# Patient Record
Sex: Female | Born: 1985 | Race: Black or African American | Hispanic: No | Marital: Single | Smoking: Never smoker
Health system: Southern US, Community
[De-identification: ages and names within clinical notes are randomized; demographics above are authoritative.]

## PROBLEM LIST (undated history)

## (undated) DIAGNOSIS — O139 Gestational [pregnancy-induced] hypertension without significant proteinuria, unspecified trimester: Secondary | ICD-10-CM

## (undated) DIAGNOSIS — I10 Essential (primary) hypertension: Secondary | ICD-10-CM

## (undated) HISTORY — PX: WISDOM TOOTH EXTRACTION: SHX21

---

## 2010-11-14 ENCOUNTER — Inpatient Hospital Stay (INDEPENDENT_AMBULATORY_CARE_PROVIDER_SITE_OTHER)
Admission: RE | Admit: 2010-11-14 | Discharge: 2010-11-14 | Disposition: A | Discharge status: Home / Self Care | Payer: Self-pay | Source: Ambulatory Visit | Attending: Family Medicine | Admitting: Family Medicine

## 2010-11-14 DIAGNOSIS — N760 Acute vaginitis: Secondary | ICD-10-CM

## 2010-11-14 LAB — POCT PREGNANCY, URINE: Preg Test, Ur: NEGATIVE

## 2010-11-14 LAB — POCT URINALYSIS DIP (DEVICE)
Ketones, ur: NEGATIVE mg/dL
Nitrite: NEGATIVE
Protein, ur: NEGATIVE mg/dL
Urobilinogen, UA: 0.2 mg/dL (ref 0.0–1.0)

## 2010-11-17 LAB — GC/CHLAMYDIA PROBE AMP, GENITAL: Chlamydia, DNA Probe: POSITIVE — AB

## 2011-08-24 ENCOUNTER — Encounter (HOSPITAL_COMMUNITY): Payer: Self-pay | Admitting: Emergency Medicine

## 2011-08-24 ENCOUNTER — Emergency Department (HOSPITAL_COMMUNITY)
Admission: EM | Admit: 2011-08-24 | Discharge: 2011-08-25 | Disposition: A | Discharge status: Home / Self Care | Payer: Self-pay | Attending: Emergency Medicine | Admitting: Emergency Medicine

## 2011-08-24 DIAGNOSIS — R070 Pain in throat: Secondary | ICD-10-CM | POA: Insufficient documentation

## 2011-08-24 DIAGNOSIS — H9209 Otalgia, unspecified ear: Secondary | ICD-10-CM | POA: Insufficient documentation

## 2011-08-24 DIAGNOSIS — J020 Streptococcal pharyngitis: Secondary | ICD-10-CM | POA: Insufficient documentation

## 2011-08-24 DIAGNOSIS — R599 Enlarged lymph nodes, unspecified: Secondary | ICD-10-CM | POA: Insufficient documentation

## 2011-08-24 DIAGNOSIS — R509 Fever, unspecified: Secondary | ICD-10-CM | POA: Insufficient documentation

## 2011-08-24 DIAGNOSIS — J351 Hypertrophy of tonsils: Secondary | ICD-10-CM | POA: Insufficient documentation

## 2011-08-24 NOTE — ED Notes (Signed)
Patient presents with c/o bilateral ear pain and sore throat.  Ears red and throat red with white patches.

## 2011-08-24 NOTE — ED Notes (Signed)
PT. REPORTS PROGRESSING SORE THROAT WITH SWELLING AND BILATERAL EAR ACHE FOR SEVERAL DAYS , DENIES FEVER OR CHILLS, NO COUGH OR CONGESTION .

## 2011-08-25 LAB — RAPID STREP SCREEN (MED CTR MEBANE ONLY): Streptococcus, Group A Screen (Direct): POSITIVE — AB

## 2011-08-25 MED ORDER — HYDROCODONE-ACETAMINOPHEN 5-325 MG PO TABS
2.0000 | ORAL_TABLET | Freq: Once | ORAL | Status: AC
  Administered 2011-08-25: 2 via ORAL
  Filled 2011-08-25: qty 2

## 2011-08-25 MED ORDER — HYDROCODONE-ACETAMINOPHEN 5-325 MG PO TABS: 2.0000 | ORAL_TABLET | ORAL | Status: AC | PRN

## 2011-08-25 MED ORDER — PENICILLIN G BENZATHINE 1200000 UNIT/2ML IM SUSP
1.2000 10*6.[IU] | INTRAMUSCULAR | Status: AC
  Administered 2011-08-25: 1.2 10*6.[IU] via INTRAMUSCULAR
  Filled 2011-08-25: qty 2

## 2011-08-25 NOTE — ED Provider Notes (Signed)
History     CSN: [id number]  Arrival date & time 08/24/11  2138   First MD Initiated Contact with Patient 08/25/11 0113      Chief Complaint  Patient presents with  . Sore Throat    (Consider location/radiation/quality/duration/timing/severity/associated sxs/prior treatment) HPI Complains of sore throat and swollen tonsils for 3 weeks with subjective fever no treatment prior to coming here also complains of bilateral your aches, mild pain is worse with swallowing not improved by anything no cough no other complaint no other associated symptoms History reviewed. No pertinent past medical history. Past medical history negative History reviewed. No pertinent past surgical history.  No family history on file.  History  Substance Use Topics  . Smoking status: Never Smoker   . Smokeless tobacco: Not on file  . Alcohol Use: No    OB History    Grav Para Term Preterm Abortions TAB SAB Ect Mult Living                  Review of Systems  Constitutional: Positive for fever.  HENT: Positive for ear pain.        Sore throat  Respiratory: Negative.   Cardiovascular: Negative.   Gastrointestinal: Negative.   Musculoskeletal: Negative.   Skin: Negative.   Neurological: Negative.   Hematological: Negative.   Psychiatric/Behavioral: Negative.   All other systems reviewed and are negative.    Allergies  Review of patient's allergies indicates no known allergies.  Home Medications  No current outpatient prescriptions on file.  BP 153/94  Pulse 103  Temp(Src) 98.9 F (37.2 C) (Oral)  Resp 18  SpO2 100%  LMP 07/26/2011  Physical Exam  Nursing note and vitals reviewed. Constitutional: She appears well-developed and well-nourished.  HENT:  Head: Normocephalic and atraumatic.       Bilateral tympanic membranes normal. Tonsils large red , with white exudate uvula midline  Eyes: Conjunctivae are normal. Pupils are equal, round, and reactive to light.  Neck: Neck supple.  No tracheal deviation present. No thyromegaly present.  Cardiovascular: Normal rate and regular rhythm.   No murmur heard. Pulmonary/Chest: Effort normal and breath sounds normal.  Abdominal: Soft. Bowel sounds are normal. She exhibits no distension. There is no tenderness.       No splenomegaly  Musculoskeletal: Normal range of motion. She exhibits no edema and no tenderness.  Lymphadenopathy:    She has cervical adenopathy.  Neurological: She is alert. Coordination normal.  Skin: Skin is warm and dry. No rash noted.  Psychiatric: She has a normal mood and affect.    ED Course  Procedures (including critical care time) 3:15 AM patient resting comfortably no distress Labs Reviewed - No data to display No results found.   No diagnosis found.    MDM  Plan prescription Norco followup at [facility] if not better in 2 or 3 days. Diagnosis strep pharyngitis        Doug Sou, MD 08/25/11 [id number]

## 2017-11-25 ENCOUNTER — Encounter (HOSPITAL_COMMUNITY): Payer: Self-pay | Admitting: Emergency Medicine

## 2017-11-25 ENCOUNTER — Other Ambulatory Visit: Payer: Self-pay

## 2017-11-25 ENCOUNTER — Emergency Department (HOSPITAL_COMMUNITY)
Admission: EM | Admit: 2017-11-25 | Discharge: 2017-11-26 | Disposition: A | Discharge status: Home / Self Care | Payer: Self-pay | Attending: Emergency Medicine | Admitting: Emergency Medicine

## 2017-11-25 DIAGNOSIS — T50901A Poisoning by unspecified drugs, medicaments and biological substances, accidental (unintentional), initial encounter: Secondary | ICD-10-CM | POA: Insufficient documentation

## 2017-11-25 LAB — CBC
HCT: 42.8 % (ref 36.0–46.0)
Hemoglobin: 14.5 g/dL (ref 12.0–15.0)
MCH: 29.9 pg (ref 26.0–34.0)
MCHC: 33.9 g/dL (ref 30.0–36.0)
MCV: 88.2 fL (ref 78.0–100.0)
PLATELETS: 372 10*3/uL (ref 150–400)
RBC: 4.85 MIL/uL (ref 3.87–5.11)
RDW: 13.8 % (ref 11.5–15.5)
WBC: 11.9 10*3/uL — ABNORMAL HIGH (ref 4.0–10.5)

## 2017-11-25 NOTE — ED Triage Notes (Signed)
Pt has had migraine for a couple days, stated she was taking Excedrin migraine throughout the day. Pt took at least 10 but is not sure the dose or how many she took. Last dose was around 2125, pt has vomited PTA

## 2017-11-26 ENCOUNTER — Other Ambulatory Visit: Payer: Self-pay

## 2017-11-26 LAB — ETHANOL

## 2017-11-26 LAB — COMPREHENSIVE METABOLIC PANEL
ALBUMIN: 5.2 g/dL — AB (ref 3.5–5.0)
ALK PHOS: 74 U/L (ref 38–126)
ALK PHOS: 75 U/L (ref 38–126)
ALT: 13 U/L — AB (ref 14–54)
ALT: 14 U/L (ref 14–54)
ANION GAP: 20 — AB (ref 5–15)
AST: 29 U/L (ref 15–41)
AST: 30 U/L (ref 15–41)
Albumin: 4.9 g/dL (ref 3.5–5.0)
Anion gap: 16 — ABNORMAL HIGH (ref 5–15)
BUN: 9 mg/dL (ref 6–20)
BUN: 8 mg/dL (ref 6–20)
CALCIUM: 10.1 mg/dL (ref 8.9–10.3)
CHLORIDE: 105 mmol/L (ref 101–111)
CO2: 19 mmol/L — ABNORMAL LOW (ref 22–32)
CO2: 19 mmol/L — ABNORMAL LOW (ref 22–32)
Calcium: 10.1 mg/dL (ref 8.9–10.3)
Chloride: 103 mmol/L (ref 101–111)
Creatinine, Ser: 1.09 mg/dL — ABNORMAL HIGH (ref 0.44–1.00)
Creatinine, Ser: 1.1 mg/dL — ABNORMAL HIGH (ref 0.44–1.00)
GFR calc Af Amer: >60 mL/min (ref >60)
GFR calc non Af Amer: >60 mL/min (ref >60)
GLUCOSE: 83 mg/dL (ref 65–99)
Glucose, Bld: 98 mg/dL (ref 65–99)
POTASSIUM: 3.4 mmol/L — AB (ref 3.5–5.1)
Potassium: 3.6 mmol/L (ref 3.5–5.1)
SODIUM: 144 mmol/L (ref 135–145)
Sodium: 138 mmol/L (ref 135–145)
TOTAL PROTEIN: 8.6 g/dL — AB (ref 6.5–8.1)
TOTAL PROTEIN: 8.7 g/dL — AB (ref 6.5–8.1)
Total Bilirubin: 1.4 mg/dL — ABNORMAL HIGH (ref 0.3–1.2)
Total Bilirubin: 1.6 mg/dL — ABNORMAL HIGH (ref 0.3–1.2)

## 2017-11-26 LAB — RAPID URINE DRUG SCREEN, HOSP PERFORMED
Amphetamines: NOT DETECTED
BARBITURATES: NOT DETECTED
Benzodiazepines: NOT DETECTED
COCAINE: POSITIVE — AB
Opiates: NOT DETECTED
Tetrahydrocannabinol: POSITIVE — AB

## 2017-11-26 LAB — ACETAMINOPHEN LEVEL: ACETAMINOPHEN (TYLENOL), SERUM: 14 ug/mL (ref 10–30)

## 2017-11-26 LAB — SALICYLATE LEVEL
SALICYLATE LVL: 7.5 mg/dL (ref 2.8–30.0)
Salicylate Lvl: 8.7 mg/dL (ref 2.8–30.0)

## 2017-11-26 LAB — I-STAT BETA HCG BLOOD, ED (MC, WL, AP ONLY): I-stat hCG, quantitative: <5 m[IU]/mL (ref <5)

## 2017-11-26 LAB — CBG MONITORING, ED: GLUCOSE-CAPILLARY: 102 mg/dL — AB (ref 65–99)

## 2017-11-26 MED ORDER — ONDANSETRON 4 MG PO TBDP
8.0000 mg | ORAL_TABLET | Freq: Once | ORAL | Status: AC
  Administered 2017-11-26: 8 mg via ORAL
  Filled 2017-11-26: qty 2

## 2017-11-26 NOTE — Discharge Instructions (Addendum)
Do not take more medication than the instructions on the bottle tell you.  For extra pain relief, you can combine acetaminophen and ibuprofen - they work in different ways, and the combination gives good pain relief.

## 2017-11-26 NOTE — ED Notes (Signed)
Spoke with Danford Bad from [employer] to report possible poisoning. She recommends repeating acetaminophen, salicylate, and CMP at 0330. Call back number left for follow up at 0530.

## 2017-11-26 NOTE — ED Notes (Signed)
Reported newest lab results to [place] from poison control and she sts pt is safe for d/c when EDP decides.

## 2017-11-26 NOTE — ED Provider Notes (Signed)
[facility] EMERGENCY DEPARTMENT Provider Note   CSN: [id number] Arrival date & time: 11/25/17  2314     History   Chief Complaint Chief Complaint  Patient presents with  . Ingestion    HPI Tracy Black is a 32 y.o. female.  The history is provided by the patient.  She states that she has had a bad headache for 2 days and she took 3 Excedrin tablets at a time on 3 separate occasions through the day yesterday.  She then became worried that she might have accidentally overdosed.  She did have some nausea and vomiting.  There is still some mild residual nausea.  Her headache has completely resolved.  She denies any abdominal pain.  She has not had any rash.  History reviewed. No pertinent past medical history.  There are no active problems to display for this patient.   History reviewed. No pertinent surgical history.   OB History   None      Home Medications    Prior to Admission medications   Not on File    Family History No family history on file.  Social History Social History   Tobacco Use  . Smoking status: Never Smoker  . Smokeless tobacco: Never Used  Substance Use Topics  . Alcohol use: No  . Drug use: No     Allergies   Patient has no known allergies.   Review of Systems Review of Systems  All other systems reviewed and are negative.    Physical Exam Updated Vital Signs BP (!) 146/108 (BP Location: Right Arm)   Pulse 80   Temp 98.3 F (36.8 C) (Oral)   Resp 18   Ht  (1.702 m)   Wt 70.3 kg (155 lb)   SpO2 100%   BMI 24.28 kg/m   Physical Exam  Nursing note and vitals reviewed.  32 year old female, resting comfortably and in no acute distress. Vital signs are significant for elevated blood pressure. Oxygen saturation is 100%, which is normal. Head is normocephalic and atraumatic. PERRLA, EOMI. Oropharynx is clear. Neck is nontender and supple without adenopathy or JVD. Back is nontender and there is no CVA  tenderness. Lungs are clear without rales, wheezes, or rhonchi. Chest is nontender. Heart has regular rate and rhythm without murmur. Abdomen is soft, flat, nontender without masses or hepatosplenomegaly and peristalsis is normoactive. Extremities have no cyanosis or edema, full range of motion is present. Skin is warm and dry without rash. Neurologic: Mental status is normal, cranial nerves are intact, there are no motor or sensory deficits.  ED Treatments / Results  Labs (all labs ordered are listed, but only abnormal results are displayed) Labs Reviewed  COMPREHENSIVE METABOLIC PANEL - Abnormal; Notable for the following components:      Result Value   Potassium 3.4 (*)    CO2 19 (*)    Creatinine, Ser 1.09 (*)    Total Protein 8.7 (*)    Albumin 5.2 (*)    Total Bilirubin 1.4 (*)    Anion gap 20 (*)    All other components within normal limits  CBC - Abnormal; Notable for the following components:   WBC 11.9 (*)    All other components within normal limits  RAPID URINE DRUG SCREEN, HOSP PERFORMED - Abnormal; Notable for the following components:   Cocaine POSITIVE (*)    Tetrahydrocannabinol POSITIVE (*)    All other components within normal limits  ACETAMINOPHEN LEVEL - Abnormal; Notable  for the following components:   Acetaminophen (Tylenol), Serum <10 (*)    All other components within normal limits  COMPREHENSIVE METABOLIC PANEL - Abnormal; Notable for the following components:   CO2 19 (*)    Creatinine, Ser 1.10 (*)    Total Protein 8.6 (*)    ALT 13 (*)    Total Bilirubin 1.6 (*)    Anion gap 16 (*)    All other components within normal limits  CBG MONITORING, ED - Abnormal; Notable for the following components:   Glucose-Capillary 102 (*)    All other components within normal limits  ETHANOL  SALICYLATE LEVEL  ACETAMINOPHEN LEVEL  SALICYLATE LEVEL  I-STAT BETA HCG BLOOD, ED ([facility], [facility], [facility] ONLY)    EKG EKG Interpretation  Date/Time:  Thursday Nov 25 2017  23:27:36 EDT Ventricular Rate:  94 PR Interval:  128 QRS Duration: 66 QT Interval:  354 QTC Calculation: 442 R Axis:   5 Text Interpretation:  Normal sinus rhythm with sinus arrhythmia Possible Left atrial enlargement Borderline ECG No old tracing to compare Confirmed by Dione Booze ([id number]) on 11/26/2017 1:44:11 AM  Procedures Procedures  Medications Ordered in ED Medications  ondansetron (ZOFRAN-ODT) disintegrating tablet 8 mg (has no administration in time range)     Initial Impression / Assessment and Plan / ED Course  I have reviewed the triage vital signs and the nursing notes.  Pertinent lab results that were available during my care of the patient were reviewed by me and considered in my medical decision making (see chart for details).  Accidental overdose.  Poison control had been consulted and recommended acetaminophen, salicylate levels and CMP's.  Initial acetaminophen level was 14 and initial salicylate level 8.7-both in the therapeutic range and nontoxic.  Repeat acetaminophen level is less than 10, repeat salicylate level 7.5.  These are not levels that would be associated with any toxicity.  Initial metabolic panel did show an elevated anion gap with a CO2 of 19.  That could have been related to salicylate ingestion.  Repeat metabolic panel shows anion gap has decreased significantly although CO2 is unchanged.  She is felt to be safe for discharge at this point.  Renal function and hepatic function tests are normal.  Patient is advised never to exceed manufactures recommendation on either dose or frequency of any medication.  Final Clinical Impressions(s) / ED Diagnoses   Final diagnoses:  Accidental drug ingestion, initial encounter    ED Discharge Orders    None       Dione Booze, MD 11/26/17 [phone]

## 2018-03-16 ENCOUNTER — Other Ambulatory Visit: Payer: Self-pay

## 2018-03-16 ENCOUNTER — Emergency Department (HOSPITAL_COMMUNITY): Payer: Self-pay

## 2018-03-16 ENCOUNTER — Encounter (HOSPITAL_COMMUNITY): Payer: Self-pay

## 2018-03-16 ENCOUNTER — Emergency Department (HOSPITAL_COMMUNITY)
Admission: EM | Admit: 2018-03-16 | Discharge: 2018-03-16 | Disposition: A | Discharge status: Home / Self Care | Payer: Self-pay | Attending: Emergency Medicine | Admitting: Emergency Medicine

## 2018-03-16 DIAGNOSIS — I10 Essential (primary) hypertension: Secondary | ICD-10-CM | POA: Insufficient documentation

## 2018-03-16 DIAGNOSIS — S29011A Strain of muscle and tendon of front wall of thorax, initial encounter: Secondary | ICD-10-CM

## 2018-03-16 DIAGNOSIS — Z79899 Other long term (current) drug therapy: Secondary | ICD-10-CM | POA: Insufficient documentation

## 2018-03-16 DIAGNOSIS — R079 Chest pain, unspecified: Secondary | ICD-10-CM | POA: Insufficient documentation

## 2018-03-16 LAB — BASIC METABOLIC PANEL
ANION GAP: 11 (ref 5–15)
BUN: 12 mg/dL (ref 6–20)
CO2: 21 mmol/L — AB (ref 22–32)
CREATININE: 0.96 mg/dL (ref 0.44–1.00)
Calcium: 9.2 mg/dL (ref 8.9–10.3)
Chloride: 106 mmol/L (ref 98–111)
GFR calc Af Amer: >60 mL/min (ref >60)
GLUCOSE: 101 mg/dL — AB (ref 70–99)
Potassium: 3.4 mmol/L — ABNORMAL LOW (ref 3.5–5.1)
Sodium: 138 mmol/L (ref 135–145)

## 2018-03-16 LAB — CBC
HCT: 39.6 % (ref 36.0–46.0)
Hemoglobin: 12.8 g/dL (ref 12.0–15.0)
MCH: 29.8 pg (ref 26.0–34.0)
MCHC: 32.3 g/dL (ref 30.0–36.0)
MCV: 92.3 fL (ref 78.0–100.0)
PLATELETS: 316 10*3/uL (ref 150–400)
RBC: 4.29 MIL/uL (ref 3.87–5.11)
RDW: 13.5 % (ref 11.5–15.5)
WBC: 9.8 10*3/uL (ref 4.0–10.5)

## 2018-03-16 LAB — I-STAT BETA HCG BLOOD, ED (MC, WL, AP ONLY): I-stat hCG, quantitative: <5 m[IU]/mL (ref <5)

## 2018-03-16 LAB — I-STAT TROPONIN, ED: Troponin i, poc: 0 ng/mL (ref 0.00–0.08)

## 2018-03-16 MED ORDER — METHOCARBAMOL 500 MG PO TABS: 500.0000 mg | ORAL_TABLET | Freq: Two times a day (BID) | ORAL | 0 refills | Status: DC | PRN

## 2018-03-16 MED ORDER — NAPROXEN 500 MG PO TABS: 500.0000 mg | ORAL_TABLET | Freq: Two times a day (BID) | ORAL | 0 refills | Status: DC | PRN

## 2018-03-16 MED ORDER — NAPROXEN 250 MG PO TABS
500.0000 mg | ORAL_TABLET | Freq: Once | ORAL | Status: AC
  Administered 2018-03-16: 500 mg via ORAL
  Filled 2018-03-16: qty 2

## 2018-03-16 MED ORDER — HYDROCODONE-ACETAMINOPHEN 5-325 MG PO TABS
1.0000 | ORAL_TABLET | Freq: Once | ORAL | Status: AC
  Administered 2018-03-16: 1 via ORAL
  Filled 2018-03-16: qty 1

## 2018-03-16 MED ORDER — AMLODIPINE BESYLATE 5 MG PO TABS: 5.0000 mg | ORAL_TABLET | Freq: Every day | ORAL | 1 refills | Status: DC

## 2018-03-16 NOTE — ED Notes (Signed)
Pt called x3 for room, no response. 

## 2018-03-16 NOTE — ED Provider Notes (Signed)
Patient placed in Quick Look pathway, seen and evaluated   Chief Complaint: chest pain  HPI:   Tracy Black is a 32 y.o. female who presents to the ED with right side chest pain that started 3 days ago. Patient reports that the pain increases with deep breath and movement. Patient took tylenol without relief. Patient reports she has been lifting her child and doing a lot of house work the past couple day. Patient rates her pain as 10/10.   ROS: M/S: chest wall pain  Physical Exam:  BP (!) 147/108 (BP Location: Right Arm)   Pulse 73   Temp 98.6 F (37 C) (Oral)   Resp 18   Ht 5\' 7"  (1.702 m)   Wt 74.8 kg   SpO2 100%   BMI 25.84 kg/m    Gen: No distress  Neuro: Awake and Alert  Skin: Warm and dry  Chest: tender with palpation to right chest wall and pain with range of motion of the right arm.      Initiation of care has begun. The patient has been counseled on the process, plan, and necessity for staying for the completion/evaluation, and the remainder of the medical screening examination    Janne Napoleoneese, Saraih Lorton M, NP 03/16/18 2052    Tilden Fossaees, Elizabeth, MD 03/17/18 [phone]

## 2018-03-16 NOTE — ED Triage Notes (Signed)
Pt reports that for the past 3 days she has been having R sided CP, worse with deep breath, movement and palpation, some nausea, denies dizziness

## 2018-03-16 NOTE — ED Provider Notes (Signed)
[facility]   CSN: [id number] Arrival date & time: 03/16/18  2035     History   Chief Complaint Chief Complaint  Patient presents with  . Chest Pain    HPI Tracy Black is a 32 y.o. female.   32 year old female with a history of hypertension (previously on Diovan) presents to the emergency department for evaluation of right-sided chest pain.  She has noticed worsening pain over the past 3 days, especially when breathing, moving, and abducting her right arm.  She has tried Tylenol for symptoms without relief.  Pain acutely worsened during the night, waking her from sleep.  She describes the pain as sharp.  She has not had any shortness of breath, syncope or near syncope, hemoptysis, leg swelling, vomiting.  No history of tobacco use, diabetes, dyslipidemia.  Is not presently on birth control.  No recent surgeries or hospitalizations.  Denies prolonged travel.     History reviewed. No pertinent past medical history.  There are no active problems to display for this patient.   History reviewed. No pertinent surgical history.   OB History   None      Home Medications    Prior to Admission medications   Medication Sig Start Date End Date Taking? Authorizing Provider  amLODipine (NORVASC) 5 MG tablet Take 1 tablet (5 mg total) by mouth daily. 03/16/18   Antony MaduraHumes, Munirah Doerner, PA-C  methocarbamol (ROBAXIN) 500 MG tablet Take 1 tablet (500 mg total) by mouth every 12 (twelve) hours as needed for muscle spasms. 03/16/18   Antony MaduraHumes, Fausto Sampedro, PA-C  naproxen (NAPROSYN) 500 MG tablet Take 1 tablet (500 mg total) by mouth every 12 (twelve) hours as needed for mild pain or moderate pain. 03/16/18   Antony MaduraHumes, Aryan Sparks, PA-C    Family History No family history on file.  Social History Social History   Tobacco Use  . Smoking status: Never Smoker  . Smokeless tobacco: Never Used  Substance Use Topics  . Alcohol use: No  . Drug use: No      Allergies   Patient has no known allergies.   Review of Systems Review of Systems Ten systems reviewed and are negative for acute change, except as noted in the HPI.    Physical Exam Updated Vital Signs BP (!) 146/108 (BP Location: Right Arm)   Pulse 70   Temp 98.6 F (37 C) (Oral)   Resp 18   Ht 5\' 7"  (1.702 m)   Wt 74.8 kg   LMP 03/15/2018   SpO2 99%   BMI 25.84 kg/m   Physical Exam  Constitutional: She is oriented to person, place, and time. She appears well-developed and well-nourished. No distress.  Nontoxic appearing and in NAD  HENT:  Head: Normocephalic and atraumatic.  Eyes: Conjunctivae and EOM are normal. No scleral icterus.  Neck: Normal range of motion.  Cardiovascular: Normal rate, regular rhythm and intact distal pulses.  Pulmonary/Chest: Effort normal. No stridor. No respiratory distress. She has no wheezes. She exhibits tenderness.  Respirations even and unlabored. Lungs CTAB. TTP to the right chest wall along the pectoralis, extending to the shoulder; also elicited with RUE abduction.  Musculoskeletal: Normal range of motion.  No BLE edema.  Neurological: She is alert and oriented to person, place, and time. She exhibits normal muscle tone. Coordination normal.  GCS 15. Moving all extremities.  Skin: Skin is warm and dry. No rash noted. She is not diaphoretic. No erythema. No pallor.  Psychiatric: She has  a normal mood and affect. Her behavior is normal.  Nursing note and vitals reviewed.    ED Treatments / Results  Labs (all labs ordered are listed, but only abnormal results are displayed) Labs Reviewed  BASIC METABOLIC PANEL - Abnormal; Notable for the following components:      Result Value   Potassium 3.4 (*)    CO2 21 (*)    Glucose, Bld 101 (*)    All other components within normal limits  CBC  I-STAT TROPONIN, ED  I-STAT BETA HCG BLOOD, ED ([facility], [facility], [facility] ONLY)    EKG EKG Interpretation  Date/Time:  Wednesday March 16 2018  20:42:46 EDT Ventricular Rate:  72 PR Interval:  180 QRS Duration: 76 QT Interval:  392 QTC Calculation: 429 R Axis:   -15 Text Interpretation:  Normal sinus rhythm Normal ECG No significant change since last tracing Confirmed by Richardean Canal [phone]) on 03/16/2018 10:57:04 PM   Radiology Dg Chest 2 View  Result Date: 03/16/2018 CLINICAL DATA:  Right side chest pain, shortness of breath EXAM: CHEST - 2 VIEW COMPARISON:  None. FINDINGS: Heart and mediastinal contours are within normal limits. No focal opacities or effusions. No acute bony abnormality. IMPRESSION: No active cardiopulmonary disease. Electronically Signed   By: Charlett Nose M.D.   On: 03/16/2018 21:28    Procedures Procedures (including critical care time)  Medications Ordered in ED Medications  naproxen (NAPROSYN) tablet 500 mg (500 mg Oral Given 03/16/18 2306)  HYDROcodone-acetaminophen (NORCO/VICODIN) 5-325 MG per tablet 1 tablet (1 tablet Oral Given 03/16/18 2306)     Initial Impression / Assessment and Plan / ED Course  I have reviewed the triage vital signs and the nursing notes.  Pertinent labs & imaging results that were available during my care of the patient were reviewed by me and considered in my medical decision making (see chart for details).     32 year old female with history of hypertension presents for right-sided chest pain.  This is reproducible on palpation along the course of the right pectoralis muscle.  Low suspicion for emergent cardiac etiology given reassuring workup today.  EKG is nonischemic and troponin negative.  Patient has a heart score of 1 consistent with low risk of acute coronary event.  Chest x-ray without evidence of mediastinal widening to suggest dissection.  No pneumothorax, pneumonia, pleural effusion.  Pulmonary embolus further considered; however, patient without tachycardia, tachypnea, dyspnea, hypoxia.  Patient is PERC negative.  Plan for management with outpatient naproxen  and Robaxin.  Patient not currently on medication for her hypertension.  She was previously prescribed Diovan, but did not like how it made her feel.  Will discharge with Norvasc.  PCP follow up in 1 week encouraged.  Return precautions discussed and provided. Patient discharged in stable condition with no unaddressed concerns.   Final Clinical Impressions(s) / ED Diagnoses   Final diagnoses:  Strain of right pectoralis muscle, initial encounter  Hypertension not at goal    ED Discharge Orders         Ordered    naproxen (NAPROSYN) 500 MG tablet  Every 12 hours PRN     03/16/18 2258    methocarbamol (ROBAXIN) 500 MG tablet  Every 12 hours PRN     03/16/18 2258    amLODipine (NORVASC) 5 MG tablet  Daily     03/16/18 2258           Antony Madura, PA-C 03/16/18 2333    Charlynne Pander, MD 03/19/18  2133  

## 2018-03-16 NOTE — Discharge Instructions (Signed)
Alternate ice and heat to areas of injury 3-4 times per day to limit inflammation and spasm.  Avoid strenuous activity and heavy lifting.  We recommend consistent use of naproxen in addition to Robaxin for muscle spasms.  Do not drive or drink alcohol after taking Robaxin as it may make you drowsy and impair your judgment.  You have been prescribed Norvasc for blood pressure management.  We recommend follow-up with a primary care doctor to ensure resolution of symptoms.  Return to the ED for any new or concerning symptoms.

## 2018-07-06 ENCOUNTER — Emergency Department (HOSPITAL_COMMUNITY)
Admission: EM | Admit: 2018-07-06 | Discharge: 2018-07-06 | Disposition: A | Discharge status: Home / Self Care | Payer: Self-pay | Attending: Emergency Medicine | Admitting: Emergency Medicine

## 2018-07-06 ENCOUNTER — Emergency Department (HOSPITAL_COMMUNITY): Payer: Self-pay

## 2018-07-06 ENCOUNTER — Encounter (HOSPITAL_COMMUNITY): Payer: Self-pay | Admitting: Emergency Medicine

## 2018-07-06 ENCOUNTER — Other Ambulatory Visit: Payer: Self-pay

## 2018-07-06 DIAGNOSIS — Z3A01 Less than 8 weeks gestation of pregnancy: Secondary | ICD-10-CM

## 2018-07-06 DIAGNOSIS — O9989 Other specified diseases and conditions complicating pregnancy, childbirth and the puerperium: Secondary | ICD-10-CM | POA: Insufficient documentation

## 2018-07-06 DIAGNOSIS — R1030 Lower abdominal pain, unspecified: Secondary | ICD-10-CM | POA: Insufficient documentation

## 2018-07-06 LAB — CBC WITH DIFFERENTIAL/PLATELET
Abs Immature Granulocytes: 0.04 K/uL (ref 0.00–0.07)
Basophils Absolute: 0 K/uL (ref 0.0–0.1)
Basophils Relative: 0 %
Eosinophils Absolute: 0.1 K/uL (ref 0.0–0.5)
Eosinophils Relative: 1 %
HCT: 40.7 % (ref 36.0–46.0)
Hemoglobin: 13.3 g/dL (ref 12.0–15.0)
Immature Granulocytes: 0 %
Lymphocytes Relative: 30 %
Lymphs Abs: 3.3 K/uL (ref 0.7–4.0)
MCH: 29.7 pg (ref 26.0–34.0)
MCHC: 32.7 g/dL (ref 30.0–36.0)
MCV: 90.8 fL (ref 80.0–100.0)
Monocytes Absolute: 0.7 K/uL (ref 0.1–1.0)
Monocytes Relative: 6 %
Neutro Abs: 6.7 K/uL (ref 1.7–7.7)
Neutrophils Relative %: 63 %
Platelets: 325 K/uL (ref 150–400)
RBC: 4.48 MIL/uL (ref 3.87–5.11)
RDW: 13 % (ref 11.5–15.5)
WBC: 10.8 K/uL — ABNORMAL HIGH (ref 4.0–10.5)
nRBC: 0 % (ref 0.0–0.2)

## 2018-07-06 LAB — URINALYSIS, ROUTINE W REFLEX MICROSCOPIC
Bilirubin Urine: NEGATIVE
Glucose, UA: NEGATIVE mg/dL
Hgb urine dipstick: NEGATIVE
Ketones, ur: 20 mg/dL — AB
Leukocytes, UA: NEGATIVE
Nitrite: NEGATIVE
Protein, ur: NEGATIVE mg/dL
Specific Gravity, Urine: 1.021 (ref 1.005–1.030)
pH: 8 (ref 5.0–8.0)

## 2018-07-06 LAB — LIPASE, BLOOD: Lipase: 22 U/L (ref 11–51)

## 2018-07-06 LAB — COMPREHENSIVE METABOLIC PANEL WITH GFR
ALT: 8 U/L (ref 0–44)
AST: 15 U/L (ref 15–41)
Albumin: 4.1 g/dL (ref 3.5–5.0)
Alkaline Phosphatase: 41 U/L (ref 38–126)
Anion gap: 12 (ref 5–15)
BUN: 8 mg/dL (ref 6–20)
CO2: 19 mmol/L — ABNORMAL LOW (ref 22–32)
Calcium: 9.4 mg/dL (ref 8.9–10.3)
Chloride: 103 mmol/L (ref 98–111)
Creatinine, Ser: 0.77 mg/dL (ref 0.44–1.00)
GFR calc Af Amer: >60 mL/min
GFR calc non Af Amer: >60 mL/min
Glucose, Bld: 90 mg/dL (ref 70–99)
Potassium: 4.2 mmol/L (ref 3.5–5.1)
Sodium: 134 mmol/L — ABNORMAL LOW (ref 135–145)
Total Bilirubin: 0.9 mg/dL (ref 0.3–1.2)
Total Protein: 6.9 g/dL (ref 6.5–8.1)

## 2018-07-06 LAB — I-STAT BETA HCG BLOOD, ED (MC, WL, AP ONLY): I-stat hCG, quantitative: >2000 m[IU]/mL — ABNORMAL HIGH

## 2018-07-06 NOTE — ED Triage Notes (Signed)
Onset today patient stated she passed out while getting her son ready for school. States lowered her self to the ground denies trauma. States took a pregnancy test yesterday and today both test positive. LMP 08/2017 states on birth control last depo shot 09/2017. Developed LLQ and RLQ abdominal pain 3/10 sore cramping intermittent pain.

## 2018-07-06 NOTE — ED Provider Notes (Signed)
[facility] Provider Note   CSN: [id number] Arrival date & time: 07/06/18  1012     History   Chief Complaint Chief Complaint  Patient presents with  . Loss of Consciousness  . Possible Pregnancy    HPI Tracy Black is a 32 y.o. female with no significant pmhx who presented to the ED today after a syncopal episode at home. Pt reports that over the past few weeks she has had intermittent lower abdominal cramps and shooting pains as well as occasional nausea. Her LMP was 08/2017. She had the depo shot in 09/2017 but has not had another one since. Her last sexual encounter was 04/2018. She took a pregnancy test yesterday and it was positive.   This morning she bent down to help her son put on his clothes when she felt very light headed. She lowered herself to the ground because she thought that she was going to pass out. She thinks that she may have actually lost consciousness for a brief moment but isn't sure. When she "came to" she felt fine and was able to drive herself to the ED. Pt states that with her previous 2 pregnancies she found out that she was pregnant after "passing out".   HPI  History reviewed. No pertinent past medical history.  There are no active problems to display for this patient.   History reviewed. No pertinent surgical history.   OB History    Gravida  1   Para      Term      Preterm      AB      Living        SAB      TAB      Ectopic      Multiple      Live Births               Home Medications    Prior to Admission medications   Medication Sig Start Date End Date Taking? Authorizing Provider  amLODipine (NORVASC) 5 MG tablet Take 1 tablet (5 mg total) by mouth daily. Patient not taking: Reported on 07/06/2018 03/16/18   Antony MaduraHumes, Kelly, PA-C  methocarbamol (ROBAXIN) 500 MG tablet Take 1 tablet (500 mg total) by mouth every 12 (twelve) hours as needed for muscle spasms. Patient not taking:  Reported on 07/06/2018 03/16/18   Antony MaduraHumes, Kelly, PA-C  naproxen (NAPROSYN) 500 MG tablet Take 1 tablet (500 mg total) by mouth every 12 (twelve) hours as needed for mild pain or moderate pain. Patient not taking: Reported on 07/06/2018 03/16/18   Antony MaduraHumes, Kelly, PA-C    Family History No family history on file.  Social History Social History   Tobacco Use  . Smoking status: Never Smoker  . Smokeless tobacco: Never Used  Substance Use Topics  . Alcohol use: No  . Drug use: No     Allergies   Patient has no known allergies.   Review of Systems Review of Systems  All other systems reviewed and are negative.    Physical Exam Updated Vital Signs BP (!) 131/92   Pulse 62   Temp 98 F (36.7 C) (Oral)   Resp 15   Ht 5\' 7"  (1.702 m)   Wt 68 kg   LMP 09/06/2017 Comment: on birth control   SpO2 100%   BMI 23.49 kg/m   Physical Exam Vitals signs and nursing note reviewed.  Constitutional:      General: She is not in  acute distress.    Appearance: She is well-developed. She is not diaphoretic.  HENT:     Head: Normocephalic and atraumatic.     Mouth/Throat:     Pharynx: No oropharyngeal exudate.  Eyes:     General: No scleral icterus.       Right eye: No discharge.        Left eye: No discharge.     Conjunctiva/sclera: Conjunctivae normal.     Pupils: Pupils are equal, round, and reactive to light.  Cardiovascular:     Rate and Rhythm: Normal rate and regular rhythm.     Heart sounds: Normal heart sounds. No murmur. No friction rub. No gallop.   Pulmonary:     Effort: Pulmonary effort is normal. No respiratory distress.     Breath sounds: Normal breath sounds. No wheezing or rales.  Chest:     Chest wall: No tenderness.  Abdominal:     General: There is no distension.     Palpations: Abdomen is soft.     Tenderness: There is no abdominal tenderness. There is no guarding.  Musculoskeletal: Normal range of motion.  Skin:    General: Skin is warm and dry.      Coloration: Skin is not pale.     Findings: No erythema or rash.  Neurological:     Mental Status: She is alert and oriented to person, place, and time.  Psychiatric:        Behavior: Behavior normal.      ED Treatments / Results  Labs (all labs ordered are listed, but only abnormal results are displayed) Labs Reviewed  COMPREHENSIVE METABOLIC PANEL - Abnormal; Notable for the following components:      Result Value   Sodium 134 (*)    CO2 19 (*)    All other components within normal limits  CBC WITH DIFFERENTIAL/PLATELET - Abnormal; Notable for the following components:   WBC 10.8 (*)    All other components within normal limits  I-STAT BETA HCG BLOOD, ED ([facility], [facility], [facility] ONLY) - Abnormal; Notable for the following components:   I-stat hCG, quantitative >2,000.0 (*)    All other components within normal limits  LIPASE, BLOOD  URINALYSIS, ROUTINE W REFLEX MICROSCOPIC    EKG None  Radiology No results found.  Procedures Procedures (including critical care time)  Medications Ordered in ED Medications - No data to display   Initial Impression / Assessment and Plan / ED Course  I have reviewed the triage vital signs and the nursing notes.  Pertinent labs & imaging results that were available during my care of the patient were reviewed by me and considered in my medical decision making (see chart for details).     Otherwise healthy 32 year old female presented to the ED today after briefly losing consciousness this morning. Pt reports bending down to pick up something and felt light headed so she lowered herself to the ground. She was reportedly out for only a few seconds before regaining consciousness. No chest pain, SOB, weakness. No hx cardiac issues, doubt ACS. No orthostasis. She reports positive pregnancy test yesterday and intermittent lower abdominal pains. No vaginal bleeding. LMP 08/2017. Last sexual encounter 04/2018. Unknown gestational age.   Hcg >2000. Will check  US OB to r/o ectopic pregnancy givne syncope, abdominal pain. CMP and CBC wnl. EKG unremarkable.   US transvaginal/OB <14 wks showed single living intrauterine pregnancy at 7 weeks. Small subchorionic hemorrhage. No evidence of ectopic. Take prenatal vitamins daily. Follow up  OBGYN. Return precautions outlined in patient discharge instructions.      Final Clinical Impressions(s) / ED Diagnoses   Final diagnoses:  None    ED Discharge Orders    None       Dub Mikes, PA-C 07/06/18 1445    Azalia Bilis, MD 07/06/18 1555

## 2018-07-06 NOTE — ED Notes (Signed)
Pt to US.

## 2018-07-06 NOTE — Discharge Instructions (Addendum)
You currently have 1 single living intrauterine fetus at 7 weeks and 2 days. Follow up with OBGYN. Take daily prenatal vitamins. Return to the ED if you experience loss of consciousness, abdominal pain, vaginal bleeding.

## 2018-08-24 ENCOUNTER — Encounter (HOSPITAL_COMMUNITY): Payer: Self-pay

## 2018-08-24 ENCOUNTER — Emergency Department (HOSPITAL_COMMUNITY)
Admission: EM | Admit: 2018-08-24 | Discharge: 2018-08-24 | Disposition: A | Discharge status: Home / Self Care | Payer: Self-pay | Attending: Emergency Medicine | Admitting: Emergency Medicine

## 2018-08-24 DIAGNOSIS — J029 Acute pharyngitis, unspecified: Secondary | ICD-10-CM | POA: Insufficient documentation

## 2018-08-24 DIAGNOSIS — H9202 Otalgia, left ear: Secondary | ICD-10-CM | POA: Insufficient documentation

## 2018-08-24 MED ORDER — AMOXICILLIN 500 MG PO CAPS: 1000.0000 mg | ORAL_CAPSULE | Freq: Every day | ORAL | 0 refills | Status: DC

## 2018-08-24 MED ORDER — FLUCONAZOLE 150 MG PO TABS: ORAL_TABLET | ORAL | 0 refills | Status: DC

## 2018-08-24 MED ORDER — NAPROXEN 500 MG PO TABS
500.0000 mg | ORAL_TABLET | Freq: Once | ORAL | Status: AC
  Administered 2018-08-24: 500 mg via ORAL

## 2018-08-24 MED ORDER — NAPROXEN 375 MG PO TABS: ORAL_TABLET | ORAL | 0 refills | Status: DC

## 2018-08-24 NOTE — ED Provider Notes (Signed)
[facility]-EMERGENCY DEPT Provider Note: Tracy Black Tracy Finkbiner, MD, FACEP  CSN: [id number] MRN: [id number] ARRIVAL: 08/24/18 at 0455 ROOM: WA09/WA09   CHIEF COMPLAINT  Ear Pain   HISTORY OF PRESENT ILLNESS  08/24/18 5:45 AM Tracy Black is a 33 y.o. female who complains of a 3-day history of severe pain in her left ear. She states it feels like something is crawling around in there and that she is having ringing in that ear.  She denies recent cold symptoms.  She is having some pressure in her throat which she does not characterize as frank pain.  The pain in her ear is causing her left-sided headache.  She has taken over-the-counter NSAIDs without adequate relief.  She denies fever.   History reviewed. No pertinent past medical history.  History reviewed. No pertinent surgical history.  No family history on file.  Social History   Tobacco Use  . Smoking status: Never Smoker  . Smokeless tobacco: Never Used  Substance Use Topics  . Alcohol use: No  . Drug use: No    Prior to Admission medications   Medication Sig Start Date End Date Taking? Authorizing Provider  amLODipine (NORVASC) 5 MG tablet Take 1 tablet (5 mg total) by mouth daily. Patient not taking: Reported on 07/06/2018 03/16/18   Antony MaduraHumes, Kelly, PA-C  methocarbamol (ROBAXIN) 500 MG tablet Take 1 tablet (500 mg total) by mouth every 12 (twelve) hours as needed for muscle spasms. Patient not taking: Reported on 07/06/2018 03/16/18   Antony MaduraHumes, Kelly, PA-C  naproxen (NAPROSYN) 500 MG tablet Take 1 tablet (500 mg total) by mouth every 12 (twelve) hours as needed for mild pain or moderate pain. Patient not taking: Reported on 07/06/2018 03/16/18   Antony MaduraHumes, Kelly, PA-C    Allergies Patient has no known allergies.   REVIEW OF SYSTEMS  Negative except as noted here or in the History of Present Illness.   PHYSICAL EXAMINATION  Initial Vital Signs Blood pressure (!) 138/111, pulse (!) 120, temperature 98 F (36.7 C), temperature source  Oral, resp. rate 18, height 5\' 7"  (1.702 m), weight 72.6 kg, last menstrual period 03/15/2018, SpO2 100 %.  Examination General: Well-developed, well-nourished female in no acute distress; appearance consistent with age of record HENT: normocephalic; TMs normal; pharyngeal erythema with exudate Eyes: Normal appearance Neck: supple; no lymphadenopathy Heart: regular rate and rhythm Lungs: clear to auscultation bilaterally Abdomen: soft; nondistended; nontender; bowel sounds present Extremities: No deformity; full range of motion Neurologic: Awake, alert and oriented; motor function intact in all extremities and symmetric; no facial droop Skin: Warm and dry Psychiatric: Normal mood and affect   RESULTS  Summary of this visit's results, reviewed by myself:   EKG Interpretation  Date/Time:    Ventricular Rate:    PR Interval:    QRS Duration:   QT Interval:    QTC Calculation:   R Axis:     Text Interpretation:        Laboratory Studies: No results found for this or any previous visit (from the past 24 hour(s)). Imaging Studies: No results found.  ED COURSE and MDM  Nursing notes and initial vitals signs, including pulse oximetry, reviewed.  Vitals:   08/24/18 0458 08/24/18 0503  BP:  (!) 138/111  Pulse:  (!) 120  Resp:  18  Temp:  98 F (36.7 C)  TempSrc:  Oral  SpO2: 96% 100%  Weight:  72.6 kg  Height:  5\' 7"  (1.702 m)   Examination not consistent with otitis media  but she does have exudative pharyngitis.  We will treat for exudative pharyngitis.  She was advised that she could be suffering from sinus pressure causing pressure on her eardrums.  She was advised to take over-the-counter Sudafed or other decongestant.  PROCEDURES    ED DIAGNOSES     ICD-10-CM   1. Acute pain of left ear H92.02   2. Exudative pharyngitis J02.9        Adlynn Lowenstein, Jonny Ruiz, MD 08/24/18 [phone]

## 2018-08-24 NOTE — ED Triage Notes (Signed)
Pt arrived via [facility] complaining of ear pain for 3 days states something has crawled in her ear and it has moved to her throat.

## 2018-08-27 ENCOUNTER — Emergency Department (HOSPITAL_COMMUNITY)
Admission: EM | Admit: 2018-08-27 | Discharge: 2018-08-27 | Disposition: A | Discharge status: Home / Self Care | Payer: Self-pay | Attending: Emergency Medicine | Admitting: Emergency Medicine

## 2018-08-27 ENCOUNTER — Emergency Department (HOSPITAL_COMMUNITY): Payer: Self-pay

## 2018-08-27 DIAGNOSIS — R0989 Other specified symptoms and signs involving the circulatory and respiratory systems: Secondary | ICD-10-CM | POA: Insufficient documentation

## 2018-08-27 DIAGNOSIS — T17228A Food in pharynx causing other injury, initial encounter: Secondary | ICD-10-CM

## 2018-08-27 DIAGNOSIS — R1110 Vomiting, unspecified: Secondary | ICD-10-CM | POA: Insufficient documentation

## 2018-08-27 LAB — CBC WITH DIFFERENTIAL/PLATELET
Abs Immature Granulocytes: 0.03 10*3/uL (ref 0.00–0.07)
Basophils Absolute: 0.1 10*3/uL (ref 0.0–0.1)
Basophils Relative: 1 %
Eosinophils Absolute: 0.2 10*3/uL (ref 0.0–0.5)
Eosinophils Relative: 2 %
HCT: 35.5 % — ABNORMAL LOW (ref 36.0–46.0)
Hemoglobin: 11.5 g/dL — ABNORMAL LOW (ref 12.0–15.0)
Immature Granulocytes: 0 %
Lymphocytes Relative: 29 %
Lymphs Abs: 2.4 10*3/uL (ref 0.7–4.0)
MCH: 30.7 pg (ref 26.0–34.0)
MCHC: 32.4 g/dL (ref 30.0–36.0)
MCV: 94.9 fL (ref 80.0–100.0)
Monocytes Absolute: 0.5 10*3/uL (ref 0.1–1.0)
Monocytes Relative: 6 %
Neutro Abs: 5 10*3/uL (ref 1.7–7.7)
Neutrophils Relative %: 62 %
Platelets: 422 10*3/uL — ABNORMAL HIGH (ref 150–400)
RBC: 3.74 MIL/uL — ABNORMAL LOW (ref 3.87–5.11)
RDW: 14.6 % (ref 11.5–15.5)
WBC: 8.2 10*3/uL (ref 4.0–10.5)
nRBC: 0 % (ref 0.0–0.2)

## 2018-08-27 LAB — BASIC METABOLIC PANEL
Anion gap: 10 (ref 5–15)
BUN: 13 mg/dL (ref 6–20)
CO2: 24 mmol/L (ref 22–32)
Calcium: 9.1 mg/dL (ref 8.9–10.3)
Chloride: 105 mmol/L (ref 98–111)
Creatinine, Ser: 0.93 mg/dL (ref 0.44–1.00)
GFR calc Af Amer: >60 mL/min (ref >60)
GFR calc non Af Amer: >60 mL/min (ref >60)
Glucose, Bld: 99 mg/dL (ref 70–99)
Potassium: 3.8 mmol/L (ref 3.5–5.1)
Sodium: 139 mmol/L (ref 135–145)

## 2018-08-27 MED ORDER — SODIUM CHLORIDE (PF) 0.9 % IJ SOLN
INTRAMUSCULAR | Status: AC
  Filled 2018-08-27: qty 50

## 2018-08-27 MED ORDER — AMOXICILLIN-POT CLAVULANATE 875-125 MG PO TABS: 1.0000 | ORAL_TABLET | Freq: Two times a day (BID) | ORAL | 0 refills | Status: DC

## 2018-08-27 MED ORDER — LIDOCAINE VISCOUS HCL 2 % MT SOLN
15.0000 mL | Freq: Once | OROMUCOSAL | Status: AC
  Administered 2018-08-27: 15 mL via ORAL
  Filled 2018-08-27: qty 15

## 2018-08-27 MED ORDER — IOHEXOL 300 MG/ML  SOLN
75.0000 mL | Freq: Once | INTRAMUSCULAR | Status: AC | PRN
  Administered 2018-08-27: 75 mL via INTRAVENOUS

## 2018-08-27 MED ORDER — SUCRALFATE 1 GM/10ML PO SUSP: 1.0000 g | Freq: Two times a day (BID) | ORAL | 0 refills | Status: DC

## 2018-08-27 MED ORDER — PROMETHAZINE HCL 25 MG/ML IJ SOLN
25.0000 mg | Freq: Once | INTRAMUSCULAR | Status: AC
  Administered 2018-08-27: 25 mg via INTRAVENOUS
  Filled 2018-08-27: qty 1

## 2018-08-27 MED ORDER — ALUM & MAG HYDROXIDE-SIMETH 200-200-20 MG/5ML PO SUSP
30.0000 mL | Freq: Once | ORAL | Status: AC
  Administered 2018-08-27: 30 mL via ORAL
  Filled 2018-08-27: qty 30

## 2018-08-27 NOTE — ED Notes (Signed)
Patient transported to CT 

## 2018-08-27 NOTE — ED Notes (Signed)
Bed: WA21 Expected date:  Expected time:  Means of arrival:  Comments: EMS 33 yo female from home-ate canned tuna 1 hour ago and states "stuck in her windpipe"-choking, coughing and gagging

## 2018-08-27 NOTE — Discharge Instructions (Addendum)
Return here for any worsening in your condition.  Follow-up with your primary doctor.  Tylenol and Motrin for any pain.

## 2018-08-27 NOTE — ED Notes (Signed)
Prior to discharge patient able to tolerate ginger ale and a [place] sandwich. Provider aware.

## 2018-08-27 NOTE — ED Provider Notes (Signed)
[facility]-EMERGENCY DEPT Provider Note   CSN: [id number] Arrival date & time: 08/27/18  0705     History   Chief Complaint Chief Complaint  Patient presents with  . Emesis    HPI Tracy Black is a 33 y.o. female.  HPI Patient states she choked on some tuna about an hour prior to arrival.  The patient states she feels like there is something stuck.  Patient states that nothing seems make the condition better or worse.  He is feeling better at this time but does feel like there is something stuck in her throat.  She states that she has been coughing and gagging some since the incident.  Patient states that was able to drink some water.  The patient denies chest pain, shortness of breath, headache,blurred vision, neck pain, fever, cough, weakness, numbness, dizziness, anorexia, edema, abdominal pain,  diarrhea, rash, back pain, dysuria, hematemesis, bloody stool, near syncope, or syncope. No past medical history on file.  There are no active problems to display for this patient.   No past surgical history on file.   OB History    Gravida  1   Para      Term      Preterm      AB      Living        SAB      TAB      Ectopic      Multiple      Live Births               Home Medications    Prior to Admission medications   Medication Sig Start Date End Date Taking? Authorizing Provider  amoxicillin (AMOXIL) 500 MG capsule Take 2 capsules (1,000 mg total) by mouth daily. 08/24/18  Yes Molpus, John, MD  fluconazole (DIFLUCAN) 150 MG tablet Take 1 tablet as needed for vaginal yeast infection.  May repeat in 3 days if symptoms persist. 08/24/18  Yes Molpus, John, MD  naproxen (NAPROSYN) 375 MG tablet Take 1 tablet twice daily as needed for pain. 08/24/18  Yes Molpus, John, MD    Family History No family history on file.  Social History Social History   Tobacco Use  . Smoking status: Never Smoker  . Smokeless tobacco: Never Used  Substance  Use Topics  . Alcohol use: No  . Drug use: No     Allergies   Patient has no known allergies.   Review of Systems Review of Systems All other systems negative except as documented in the HPI. All pertinent positives and negatives as reviewed in the HPI.   Physical Exam Updated Vital Signs BP 113/74   Pulse 73   Temp 98.6 F (37 C) (Oral)   Resp 15   Ht 5\' 7"  (1.702 m)   Wt 72.6 kg   LMP 03/15/2018   SpO2 100%   BMI 25.06 kg/m   Physical Exam Vitals signs and nursing note reviewed.  Constitutional:      General: She is not in acute distress.    Appearance: She is well-developed.  HENT:     Head: Normocephalic and atraumatic.  Eyes:     Pupils: Pupils are equal, round, and reactive to light.  Neck:     Musculoskeletal: Normal range of motion and neck supple.  Cardiovascular:     Rate and Rhythm: Normal rate and regular rhythm.     Heart sounds: Normal heart sounds. No murmur. No friction rub. No gallop.  Pulmonary:     Effort: Pulmonary effort is normal. No respiratory distress.     Breath sounds: Normal breath sounds. No wheezing.  Abdominal:     General: Bowel sounds are normal. There is no distension.     Palpations: Abdomen is soft.     Tenderness: There is no abdominal tenderness.  Skin:    General: Skin is warm and dry.     Capillary Refill: Capillary refill takes less than 2 seconds.     Findings: No erythema or rash.  Neurological:     Mental Status: She is alert and oriented to person, place, and time.     Motor: No abnormal muscle tone.     Coordination: Coordination normal.  Psychiatric:        Behavior: Behavior normal.      ED Treatments / Results  Labs (all labs ordered are listed, but only abnormal results are displayed) Labs Reviewed  CBC WITH DIFFERENTIAL/PLATELET - Abnormal; Notable for the following components:      Result Value   RBC 3.74 (*)    Hemoglobin 11.5 (*)    HCT 35.5 (*)    Platelets 422 (*)    All other components  within normal limits  BASIC METABOLIC PANEL    EKG None  Radiology No results found.  Procedures Procedures (including critical care time)  Medications Ordered in ED Medications  promethazine (PHENERGAN) injection 25 mg (25 mg Intravenous Given 08/27/18 0847)     Initial Impression / Assessment and Plan / ED Course  I have reviewed the triage vital signs and the nursing notes.  Pertinent labs & imaging results that were available during my care of the patient were reviewed by me and considered in my medical decision making (see chart for details).    Patient is able to swallow and eat here in the emergency department she feels like there is some irritation in the throat and chest.  Patient has not been vomiting here over the last few hours.  She will be discharged home and told to come back for any worsening in her condition.  The patient agrees the plan and all questions were answered.   Final Clinical Impressions(s) / ED Diagnoses   Final diagnoses:  None    ED Discharge Orders    None       Charlestine Night, PA-C 08/27/18 1603    Lorre Nick, MD 08/29/18 1329

## 2018-08-27 NOTE — ED Triage Notes (Signed)
Transported by [facility] from home-- ate canned tuna 1 hr ago, believes that food is stuck. +n/v x 1 hr. VSS.

## 2018-08-27 NOTE — ED Notes (Signed)
ED Provider at bedside. 

## 2018-10-05 ENCOUNTER — Encounter (HOSPITAL_COMMUNITY): Payer: Self-pay

## 2018-10-05 ENCOUNTER — Emergency Department (HOSPITAL_COMMUNITY)
Admission: EM | Admit: 2018-10-05 | Discharge: 2018-10-05 | Disposition: A | Discharge status: Home / Self Care | Payer: Self-pay | Attending: Emergency Medicine | Admitting: Emergency Medicine

## 2018-10-05 DIAGNOSIS — Y998 Other external cause status: Secondary | ICD-10-CM | POA: Insufficient documentation

## 2018-10-05 DIAGNOSIS — W5911XA Bitten by nonvenomous snake, initial encounter: Secondary | ICD-10-CM | POA: Insufficient documentation

## 2018-10-05 DIAGNOSIS — S61051A Open bite of right thumb without damage to nail, initial encounter: Secondary | ICD-10-CM | POA: Insufficient documentation

## 2018-10-05 DIAGNOSIS — Y93E1 Activity, personal bathing and showering: Secondary | ICD-10-CM | POA: Insufficient documentation

## 2018-10-05 DIAGNOSIS — M79641 Pain in right hand: Secondary | ICD-10-CM | POA: Insufficient documentation

## 2018-10-05 DIAGNOSIS — Y92002 Bathroom of unspecified non-institutional (private) residence single-family (private) house as the place of occurrence of the external cause: Secondary | ICD-10-CM | POA: Insufficient documentation

## 2018-10-05 LAB — BASIC METABOLIC PANEL
Anion gap: 9 (ref 5–15)
BUN: 7 mg/dL (ref 6–20)
CO2: 24 mmol/L (ref 22–32)
Calcium: 9.8 mg/dL (ref 8.9–10.3)
Chloride: 106 mmol/L (ref 98–111)
Creatinine, Ser: 0.76 mg/dL (ref 0.44–1.00)
GFR calc Af Amer: >60 mL/min (ref >60)
GFR calc non Af Amer: >60 mL/min (ref >60)
Glucose, Bld: 90 mg/dL (ref 70–99)
Potassium: 4.2 mmol/L (ref 3.5–5.1)
Sodium: 139 mmol/L (ref 135–145)

## 2018-10-05 LAB — CBC WITH DIFFERENTIAL/PLATELET
Abs Immature Granulocytes: 0.03 10*3/uL (ref 0.00–0.07)
Basophils Absolute: 0 10*3/uL (ref 0.0–0.1)
Basophils Relative: 0 %
Eosinophils Absolute: 0 10*3/uL (ref 0.0–0.5)
Eosinophils Relative: 0 %
HCT: 39.3 % (ref 36.0–46.0)
Hemoglobin: 12.5 g/dL (ref 12.0–15.0)
Immature Granulocytes: 0 %
Lymphocytes Relative: 28 %
Lymphs Abs: 2.4 10*3/uL (ref 0.7–4.0)
MCH: 29.2 pg (ref 26.0–34.0)
MCHC: 31.8 g/dL (ref 30.0–36.0)
MCV: 91.8 fL (ref 80.0–100.0)
Monocytes Absolute: 0.7 10*3/uL (ref 0.1–1.0)
Monocytes Relative: 8 %
Neutro Abs: 5.4 10*3/uL (ref 1.7–7.7)
Neutrophils Relative %: 64 %
PLATELETS: 296 10*3/uL (ref 150–400)
RBC: 4.28 MIL/uL (ref 3.87–5.11)
RDW: 12.8 % (ref 11.5–15.5)
WBC: 8.6 10*3/uL (ref 4.0–10.5)
nRBC: 0 % (ref 0.0–0.2)

## 2018-10-05 LAB — PROTIME-INR
INR: 1.1 (ref 0.8–1.2)
Prothrombin Time: 13.7 seconds (ref 11.4–15.2)

## 2018-10-05 LAB — HCG, QUANTITATIVE, PREGNANCY: hCG, Beta Chain, Quant, S: 3 m[IU]/mL (ref <5)

## 2018-10-05 LAB — I-STAT BETA HCG BLOOD, ED (MC, WL, AP ONLY): I-stat hCG, quantitative: <5 m[IU]/mL (ref <5)

## 2018-10-05 LAB — FIBRINOGEN: Fibrinogen: 313 mg/dL (ref 210–475)

## 2018-10-05 MED ORDER — IBUPROFEN 400 MG PO TABS
600.0000 mg | ORAL_TABLET | Freq: Once | ORAL | Status: AC
  Administered 2018-10-05: 600 mg via ORAL
  Filled 2018-10-05: qty 1

## 2018-10-05 NOTE — ED Provider Notes (Signed)
Patient report suspected snakebite to right thumb this a.m. while she was bathing or showering.  On exam, right thumb was of normal appearance, no puncture wound noted, no obvious swelling appreciated.  She does have some tenderness to palpation.  Patient was monitored in the ED for 3 hours following snakebite protocol.  Labs are reassuring, symptoms not worsening, vital signs stable.  Reassurance given.  At this time no evidence of envenomation and patient does not need antivenom.  Return precautions discussed.  BP (!) 148/103   Pulse 74   Temp 97.8 F (36.6 C) (Oral)   Resp 18   Ht 5\' 7"  (1.702 m)   Wt 68 kg   LMP 08/27/2018 Comment: currently on period per pt  SpO2 100%   BMI 23.49 kg/m   Results for orders placed or performed during the hospital encounter of 10/05/18  Fibrinogen  Result Value Ref Range   Fibrinogen 313 210 - 475 mg/dL  Protime-INR  Result Value Ref Range   Prothrombin Time 13.7 11.4 - 15.2 seconds   INR 1.1 0.8 - 1.2  CBC with Differential/Platelet  Result Value Ref Range   WBC 8.6 4.0 - 10.5 K/uL   RBC 4.28 3.87 - 5.11 MIL/uL   Hemoglobin 12.5 12.0 - 15.0 g/dL   HCT 09.3 26.7 - 12.4 %   MCV 91.8 80.0 - 100.0 fL   MCH 29.2 26.0 - 34.0 pg   MCHC 31.8 30.0 - 36.0 g/dL   RDW 58.0 99.8 - 33.8 %   Platelets 296 150 - 400 K/uL   nRBC 0.0 0.0 - 0.2 %   Neutrophils Relative % 64 %   Neutro Abs 5.4 1.7 - 7.7 K/uL   Lymphocytes Relative 28 %   Lymphs Abs 2.4 0.7 - 4.0 K/uL   Monocytes Relative 8 %   Monocytes Absolute 0.7 0.1 - 1.0 K/uL   Eosinophils Relative 0 %   Eosinophils Absolute 0.0 0.0 - 0.5 K/uL   Basophils Relative 0 %   Basophils Absolute 0.0 0.0 - 0.1 K/uL   Immature Granulocytes 0 %   Abs Immature Granulocytes 0.03 0.00 - 0.07 K/uL  Basic metabolic panel  Result Value Ref Range   Sodium 139 135 - 145 mmol/L   Potassium 4.2 3.5 - 5.1 mmol/L   Chloride 106 98 - 111 mmol/L   CO2 24 22 - 32 mmol/L   Glucose, Bld 90 70 - 99 mg/dL   BUN 7 6 - 20  mg/dL   Creatinine, Ser 2.50 0.44 - 1.00 mg/dL   Calcium 9.8 8.9 - 53.9 mg/dL   GFR calc non Af Amer >60 >60 mL/min   GFR calc Af Amer >60 >60 mL/min   Anion gap 9 5 - 15  hCG, quantitative, pregnancy  Result Value Ref Range   hCG, Beta Chain, Quant, S 3 <5 mIU/mL  I-Stat beta hCG blood, ED ([facility], [facility], [facility] only)  Result Value Ref Range   I-stat hCG, quantitative <5.0 <5 mIU/mL   Comment 3           No results found.    Fayrene Helper, PA-C 10/05/18 0759    Gerhard Munch, MD 10/05/18 [phone]

## 2018-10-05 NOTE — Discharge Instructions (Signed)
Your bite wound is felt to be low risk of serious injury.

## 2018-10-05 NOTE — ED Provider Notes (Signed)
[facility] EMERGENCY DEPARTMENT Provider Note   CSN: [id number] Arrival date & time: 10/05/18  0455    History   Chief Complaint Chief Complaint  Patient presents with  . Snake Bite    HPI Tracy Black is a 33 y.o. female.     Patient to ED with complaint of snake bite to right hand. She reports she was taking a shower in the dark minutes prior to arrival and saw what she thought was a hair extension/weave on the shower floor. She picked it up and reports it was a small "baby snake" that bit her at the base of the right thumb nail. She complains of pain to the thumb that extends to the volar forearm. She is not aware of what kind of snake it was but confirms that she saw it after turning on the lights before it disappeared down the drain. She denies any wound or bleeding at the time or since the injury.  The history is provided by the patient. No language interpreter was used.    History reviewed. No pertinent past medical history.  There are no active problems to display for this patient.   History reviewed. No pertinent surgical history.   OB History    Gravida  1   Para      Term      Preterm      AB      Living        SAB      TAB      Ectopic      Multiple      Live Births               Home Medications    Prior to Admission medications   Medication Sig Start Date End Date Taking? Authorizing Provider  amoxicillin (AMOXIL) 500 MG capsule Take 2 capsules (1,000 mg total) by mouth daily. 08/24/18   Molpus, John, MD  amoxicillin-clavulanate (AUGMENTIN) 875-125 MG tablet Take 1 tablet by mouth every 12 (twelve) hours. 08/27/18   Lawyer, Cristal Deer, PA-C  fluconazole (DIFLUCAN) 150 MG tablet Take 1 tablet as needed for vaginal yeast infection.  May repeat in 3 days if symptoms persist. 08/24/18   Molpus, Jonny Ruiz, MD  naproxen (NAPROSYN) 375 MG tablet Take 1 tablet twice daily as needed for pain. 08/24/18   Molpus, John, MD  sucralfate  (CARAFATE) 1 GM/10ML suspension Take 10 mLs (1 g total) by mouth 2 (two) times daily. 08/27/18   Charlestine Night, PA-C    Family History No family history on file.  Social History Social History   Tobacco Use  . Smoking status: Never Smoker  . Smokeless tobacco: Never Used  Substance Use Topics  . Alcohol use: No  . Drug use: No     Allergies   Patient has no known allergies.   Review of Systems Review of Systems  Respiratory: Negative for shortness of breath.   Gastrointestinal: Negative for nausea.  Musculoskeletal:       See HPI.  Skin: Negative for color change and wound.  Neurological: Negative for numbness.     Physical Exam Updated Vital Signs Ht 5\' 7"  (1.702 m)   Wt 68 kg   LMP 08/27/2018 Comment: currently on period per pt  BMI 23.49 kg/m   Physical Exam Constitutional:      Appearance: She is well-developed.  Neck:     Musculoskeletal: Normal range of motion.  Pulmonary:     Effort: Pulmonary effort is  normal.  Musculoskeletal:     Comments: Right hand has no swelling, discoloration, wound, bleeding. Nail of right thumb intact without subungual hematoma. Cap RF < 2s. FROM all joints of right hand and wrist.   Skin:    General: Skin is warm and dry.     Capillary Refill: Capillary refill takes less than 2 seconds.  Neurological:     Mental Status: She is alert and oriented to person, place, and time.     Sensory: No sensory deficit.      ED Treatments / Results  Labs (all labs ordered are listed, but only abnormal results are displayed) Labs Reviewed - No data to display  EKG None  Radiology No results found.  Procedures Procedures (including critical care time)  Medications Ordered in ED Medications  ibuprofen (ADVIL,MOTRIN) tablet 600 mg (has no administration in time range)     Initial Impression / Assessment and Plan / ED Course  I have reviewed the triage vital signs and the nursing notes.  Pertinent labs & imaging  results that were available during my care of the patient were reviewed by me and considered in my medical decision making (see chart for details).        Patient to ED with complaint of snake bite to right thumb minutes prior to arrival.   There is no significant finding on exam. No discoloration, swelling, bleeding or wound. She reports the snake appeared to be a baby. Consider the likelihood of envenomation low.   Snake Severity Score algorithm in place. Measurements for swelling will be documented every hour of the thumb, wrist and mid-shaft forearm. She will be observed for a period of 3 hours and if no swelling, redness occur she is felt at low risk of injury from snake bite.   Patient care signed out to oncoming provider for serial exams for development of any symptoms.   Final Clinical Impressions(s) / ED Diagnoses   Final diagnoses:  None   1. Snake bite, right hand  ED Discharge Orders    None       Elpidio Anis, PA-C 10/05/18 0557    Ward, Layla Maw, DO 10/05/18 0559

## 2018-10-05 NOTE — ED Triage Notes (Signed)
Patient states she was taking a shower tonight when she reached down to pick up a "hair" and felt a sharp bite on her RIGHT thumb. States "I didn't get a good look at it because I was taking a shower in the dark but I saw it slither away". Denies SOB, swelling, chest pain, or any other symptoms at this time.

## 2018-10-05 NOTE — ED Notes (Signed)
Thumb: 6 cm Hand: 21.5 cm  Wrist: 17 cm

## 2018-10-05 NOTE — ED Notes (Signed)
For snake bite assessment. Three measurements taken at thumb, bottom of hand and wrist were taken to assess progression of swelling or redness up arm. Exact location of measurements were marked on pt's arm to insure accuracy each time pt was measured.   Thumb: 6 cm Hand: 21.5 cm Wrist: 17 cm

## 2018-10-05 NOTE — ED Notes (Signed)
Thumb: 5.8cm Wrist: 19cm Arm: 17cm

## 2018-10-20 ENCOUNTER — Other Ambulatory Visit: Payer: Self-pay

## 2018-10-20 ENCOUNTER — Encounter (HOSPITAL_COMMUNITY): Payer: Self-pay | Admitting: Emergency Medicine

## 2018-10-20 ENCOUNTER — Encounter (HOSPITAL_COMMUNITY): Payer: Self-pay

## 2018-10-20 ENCOUNTER — Emergency Department (HOSPITAL_COMMUNITY)
Admission: EM | Admit: 2018-10-20 | Discharge: 2018-10-20 | Disposition: A | Discharge status: Home / Self Care | Payer: Medicaid Other | Attending: Emergency Medicine | Admitting: Emergency Medicine

## 2018-10-20 DIAGNOSIS — H9202 Otalgia, left ear: Secondary | ICD-10-CM

## 2018-10-20 DIAGNOSIS — Z79899 Other long term (current) drug therapy: Secondary | ICD-10-CM | POA: Insufficient documentation

## 2018-10-20 NOTE — ED Triage Notes (Signed)
Pt states there in a bug in the back of her ear that is moving and repositioning constantly.

## 2018-10-20 NOTE — ED Notes (Signed)
Pt given discharge paperwork and verbalized understanding.

## 2018-10-20 NOTE — ED Provider Notes (Signed)
[facility]EMERGENCY DEPT Provider Note  CSN: [id number] Arrival date & time: 10/20/18 4604  Chief Complaint(s) Otalgia  HPI [facility] is a 33 y.o. female here with left ear helix pain at prior piercing site. Thinks there might have been a bug inside. States that she was squeezing it and "pus" came out earlier today. No fevers, redness, or trauma.  Seen at [facility] for the same.  HPI  Past Medical History History reviewed. No pertinent past medical history. There are no active problems to display for this patient.  Home Medication(s) Prior to Admission medications   Medication Sig Start Date End Date Taking? Authorizing Provider  amoxicillin (AMOXIL) 500 MG capsule Take 2 capsules (1,000 mg total) by mouth daily. 08/24/18   Molpus, John, MD  amoxicillin-clavulanate (AUGMENTIN) 875-125 MG tablet Take 1 tablet by mouth every 12 (twelve) hours. 08/27/18   Lawyer, Cristal Deer, PA-C  fluconazole (DIFLUCAN) 150 MG tablet Take 1 tablet as needed for vaginal yeast infection.  May repeat in 3 days if symptoms persist. 08/24/18   Molpus, Jonny Ruiz, MD  naproxen (NAPROSYN) 375 MG tablet Take 1 tablet twice daily as needed for pain. 08/24/18   Molpus, John, MD  sucralfate (CARAFATE) 1 GM/10ML suspension Take 10 mLs (1 g total) by mouth 2 (two) times daily. 08/27/18   Charlestine Night, PA-C                                                                                                                                    Past Surgical History History reviewed. No pertinent surgical history. Family History No family history on file.  Social History Social History   Tobacco Use  . Smoking status: Never Smoker  . Smokeless tobacco: Never Used  Substance Use Topics  . Alcohol use: No  . Drug use: No   Allergies Patient has no known allergies.  Review of Systems Review of Systems As noted above Physical Exam Vital Signs  I have reviewed the triage vital signs BP (!) 182/112 (BP  Location: Left Arm)   Pulse (!) 110   Temp 98.6 F (37 C) (Oral)   Resp 17   LMP 08/27/2018 Comment: currently on period per pt  SpO2 99%   Physical Exam Vitals signs reviewed.  Constitutional:      General: She is not in acute distress.    Appearance: She is well-developed. She is not diaphoretic.  HENT:     Head: Normocephalic and atraumatic.     Right Ear: External ear normal.     Left Ear: External ear normal.     Ears:      Nose: Nose normal.  Eyes:     General: No scleral icterus.    Conjunctiva/sclera: Conjunctivae normal.  Neck:     Musculoskeletal: Normal range of motion.     Trachea: Phonation normal.  Cardiovascular:     Rate and Rhythm: Normal rate and regular rhythm.  Pulmonary:     Effort: Pulmonary effort is normal. No respiratory distress.     Breath sounds: No stridor.  Abdominal:     General: There is no distension.  Musculoskeletal: Normal range of motion.  Skin:    Comments: No bites or rashes noted  Neurological:     Mental Status: She is alert and oriented to person, place, and time.  Psychiatric:        Behavior: Behavior normal.     ED Results and Treatments Labs (all labs ordered are listed, but only abnormal results are displayed) Labs Reviewed - No data to display                                                                                                                       EKG  EKG Interpretation  Date/Time:    Ventricular Rate:    PR Interval:    QRS Duration:   QT Interval:    QTC Calculation:   R Axis:     Text Interpretation:        Radiology No results found. Pertinent labs & imaging results that were available during my care of the patient were reviewed by me and considered in my medical decision making (see chart for details).  Medications Ordered in ED Medications - No data to display                                                                                                                                   Procedures Procedures  (including critical care time)  Medical Decision Making / ED Course I have reviewed the nursing notes for this encounter and the patient's prior records (if available in EHR or on provided paperwork).    Video taken by patient showed serosanguinous fluid in the wound. No insects noted. No superimposed infection. Recommended not manipulating area. Monitor for erythema or swelling.  The patient appears reasonably screened and/or stabilized for discharge and I doubt any other medical condition or other [facility] requiring further screening, evaluation, or treatment in the ED at this time prior to discharge.  The patient is safe for discharge with strict return precautions.    Final Clinical Impression(s) / ED Diagnoses Final diagnoses:  Ear discomfort, left   Disposition: Discharge  Condition: Good  I have discussed the results, Dx and Tx plan with the patient who expressed understanding and agree(s) with the plan. Discharge instructions  discussed at great length. The patient was given strict return precautions who verbalized understanding of the instructions. No further questions at time of discharge.    ED Discharge Orders    None       Follow Up: Primary care provider  Schedule an appointment as soon as possible for a visit  If you do not have a primary care physician, contact [facility] at [phone] for referral      This chart was dictated using voice recognition software.  Despite best efforts to proofread,  errors can occur which can change the documentation meaning.   Nira Conn, MD 10/20/18 [phone]

## 2018-10-20 NOTE — ED Triage Notes (Signed)
Pt arrived stating she had a piercing done years ago and now there is pain at the site stating she feels like there is something in there. Pt states she has not had an earring in it in a long time. Pt just discharged from [facility] for same.

## 2018-10-20 NOTE — ED Notes (Signed)
Patient verbalizes understanding of discharge instructions. Opportunity for questioning and answers were provided. Armband removed by staff, pt discharged from ED ambulatory.   

## 2018-10-20 NOTE — ED Provider Notes (Signed)
[facility] Provider Note   CSN: [id number] Arrival date & time: 10/20/18  0252    History   Chief Complaint Chief Complaint  Patient presents with  . Something in Ear    HPI [facility] is a 33 y.o. female.     The history is provided by the patient and medical records.     33 y.o. F here with left ear pain.  States she has a piercing in the left helix and took it out because she felt like there was a bug in her ear.  She shows me a video on her phone of "the bug coming out of the hole".  Patient is grabbing her ear several times during exam and yelling "ouch".  History reviewed. No pertinent past medical history.  There are no active problems to display for this patient.   History reviewed. No pertinent surgical history.   OB History    Gravida  1   Para      Term      Preterm      AB      Living        SAB      TAB      Ectopic      Multiple      Live Births               Home Medications    Prior to Admission medications   Medication Sig Start Date End Date Taking? Authorizing Provider  amoxicillin (AMOXIL) 500 MG capsule Take 2 capsules (1,000 mg total) by mouth daily. 08/24/18   Molpus, John, MD  amoxicillin-clavulanate (AUGMENTIN) 875-125 MG tablet Take 1 tablet by mouth every 12 (twelve) hours. 08/27/18   Lawyer, Tracy Deer, PA-C  fluconazole (DIFLUCAN) 150 MG tablet Take 1 tablet as needed for vaginal yeast infection.  May repeat in 3 days if symptoms persist. 08/24/18   Molpus, Tracy Ruiz, MD  naproxen (NAPROSYN) 375 MG tablet Take 1 tablet twice daily as needed for pain. 08/24/18   Molpus, John, MD  sucralfate (CARAFATE) 1 GM/10ML suspension Take 10 mLs (1 g total) by mouth 2 (two) times daily. 08/27/18   Tracy Night, PA-C    Family History History reviewed. No pertinent family history.  Social History Social History   Tobacco Use  . Smoking status: Never Smoker  . Smokeless tobacco: Never Used   Substance Use Topics  . Alcohol use: No  . Drug use: No     Allergies   Patient has no known allergies.   Review of Systems Review of Systems  HENT: Positive for ear pain.   All other systems reviewed and are negative.    Physical Exam Updated Vital Signs BP (!) 179/124 (BP Location: Right Arm)   Pulse (!) 111   Temp 98.2 F (36.8 C) (Oral)   Resp 16   Ht 5\' 7"  (1.702 m)   Wt 68 kg   LMP 08/27/2018 Comment: currently on period per pt  SpO2 99%   BMI 23.48 kg/m   Physical Exam Vitals signs and nursing note reviewed.  Constitutional:      Appearance: She is well-developed.  HENT:     Head: Normocephalic and atraumatic.     Ears:      Comments: No FB visible in the left ear canal Eyes:     Conjunctiva/sclera: Conjunctivae normal.     Pupils: Pupils are equal, round, and reactive to light.  Neck:     Musculoskeletal:  Normal range of motion.  Cardiovascular:     Rate and Rhythm: Normal rate and regular rhythm.     Heart sounds: Normal heart sounds.  Pulmonary:     Effort: Pulmonary effort is normal.     Breath sounds: Normal breath sounds.  Abdominal:     General: Bowel sounds are normal.     Palpations: Abdomen is soft.  Musculoskeletal: Normal range of motion.  Skin:    General: Skin is warm and dry.  Neurological:     Mental Status: She is alert and oriented to person, place, and time.      ED Treatments / Results  Labs (all labs ordered are listed, but only abnormal results are displayed) Labs Reviewed - No data to display  EKG None  Radiology No results found.  Procedures Procedures (including critical care time)  Medications Ordered in ED Medications - No data to display   Initial Impression / Assessment and Plan / ED Course  I have reviewed the triage vital signs and the nursing notes.  Pertinent labs & imaging results that were available during my care of the patient were reviewed by me and considered in my medical decision  making (see chart for details).  33 year old female presenting to the ED with concern of bug in her left ear.  She had a piercing to the left helix but remove this and is concerned there is now a bug in the "piercing hole".  She shows me a video on her cell phone of "bug coming in and out of hole".  I do not see any evidence of this on the video, does have some blood and appears to be a reflection of light.  I have thoroughly examined her ear externally and internally and do not see any evidence of insect or other foreign body.  Patient was reassured and discharged.  Final Clinical Impressions(s) / ED Diagnoses   Final diagnoses:  Left ear pain    ED Discharge Orders    None       Garlon Hatchet, PA-C 10/20/18 0314    Gilda Crease, MD 10/21/18 [phone]

## 2018-10-24 ENCOUNTER — Emergency Department (HOSPITAL_COMMUNITY)
Admission: EM | Admit: 2018-10-24 | Discharge: 2018-10-24 | Disposition: A | Discharge status: Home / Self Care | Payer: Medicaid Other | Attending: Emergency Medicine | Admitting: Emergency Medicine

## 2018-10-24 ENCOUNTER — Other Ambulatory Visit: Payer: Self-pay

## 2018-10-24 ENCOUNTER — Encounter (HOSPITAL_COMMUNITY): Payer: Self-pay

## 2018-10-24 DIAGNOSIS — H9202 Otalgia, left ear: Secondary | ICD-10-CM | POA: Insufficient documentation

## 2018-10-24 DIAGNOSIS — Z79899 Other long term (current) drug therapy: Secondary | ICD-10-CM | POA: Diagnosis not present

## 2018-10-24 MED ORDER — IBUPROFEN 600 MG PO TABS: 600.0000 mg | ORAL_TABLET | Freq: Four times a day (QID) | ORAL | 0 refills | Status: DC | PRN

## 2018-10-24 NOTE — Discharge Instructions (Addendum)
Take ibuprofen regularly for discomfort. It is also recommended that you take an antihistamine like Zyrtec daily to see if this offers any relief of your ongoing ear pain.   Follow up with ENT for further evaluation if pain continues.

## 2018-10-24 NOTE — ED Triage Notes (Signed)
Pt reports having pain at the top of her outer ear with discharge. Now notes pain inside her inner ear. Inner Burning pain.

## 2018-10-24 NOTE — ED Provider Notes (Signed)
[facility] EMERGENCY DEPARTMENT Provider Note   CSN: [id number] Arrival date & time: 10/24/18  0147    History   Chief Complaint Chief Complaint  Patient presents with  . Otalgia    Inner and outer ear pain    HPI [facility] is a 33 y.o. female.     Patient returns to the ED with complaint of left ear pain for several days. It started at the pinna of the ear at the site of a previous piercing that started draining and causing pain. She was seen x 2 on 10/20/18 for evaluation of this. Now, she states the pain is in the inner ear described as fullness and burning. She took Tylenol for pain without significant relief. No sore throat, congestion, sneezing, fever. No drainage or bleeding from the ear. No changes in hearing.  The history is provided by the patient. No language interpreter was used.  Otalgia  Associated symptoms: no congestion, no ear discharge, no fever, no hearing loss and no sore throat     History reviewed. No pertinent past medical history.  There are no active problems to display for this patient.   History reviewed. No pertinent surgical history.   OB History    Gravida  1   Para      Term      Preterm      AB      Living        SAB      TAB      Ectopic      Multiple      Live Births               Home Medications    Prior to Admission medications   Medication Sig Start Date End Date Taking? Authorizing Provider  amoxicillin (AMOXIL) 500 MG capsule Take 2 capsules (1,000 mg total) by mouth daily. 08/24/18   Molpus, John, MD  amoxicillin-clavulanate (AUGMENTIN) 875-125 MG tablet Take 1 tablet by mouth every 12 (twelve) hours. 08/27/18   Lawyer, Cristal Deer, PA-C  fluconazole (DIFLUCAN) 150 MG tablet Take 1 tablet as needed for vaginal yeast infection.  May repeat in 3 days if symptoms persist. 08/24/18   Molpus, Jonny Ruiz, MD  naproxen (NAPROSYN) 375 MG tablet Take 1 tablet twice daily as needed for pain. 08/24/18   Molpus,  John, MD  sucralfate (CARAFATE) 1 GM/10ML suspension Take 10 mLs (1 g total) by mouth 2 (two) times daily. 08/27/18   Charlestine Night, PA-C    Family History History reviewed. No pertinent family history.  Social History Social History   Tobacco Use  . Smoking status: Never Smoker  . Smokeless tobacco: Never Used  Substance Use Topics  . Alcohol use: No  . Drug use: No     Allergies   Patient has no known allergies.   Review of Systems Review of Systems  Constitutional: Negative for fever.  HENT: Positive for ear pain. Negative for congestion, ear discharge, hearing loss, sinus pressure, sneezing and sore throat.   Gastrointestinal: Negative for nausea.  Musculoskeletal: Negative for neck stiffness.     Physical Exam Updated Vital Signs BP (!) 169/104 (BP Location: Right Arm)   Pulse (!) 101   Temp 98.7 F (37.1 C) (Oral)   Ht 5\' 7"  (1.702 m)   Wt 68 kg   LMP 08/27/2018 Comment: currently on period per pt  SpO2 100%   BMI 23.48 kg/m   Physical Exam Constitutional:  Appearance: She is well-developed.  HENT:     Head: Normocephalic.     Left Ear: Tympanic membrane, ear canal and external ear normal. There is no impacted cerumen.  Neck:     Musculoskeletal: Normal range of motion.  Pulmonary:     Effort: Pulmonary effort is normal.  Skin:    General: Skin is warm and dry.  Neurological:     Mental Status: She is alert and oriented to person, place, and time.      ED Treatments / Results  Labs (all labs ordered are listed, but only abnormal results are displayed) Labs Reviewed - No data to display  EKG None  Radiology No results found.  Procedures Procedures (including critical care time)  Medications Ordered in ED Medications - No data to display   Initial Impression / Assessment and Plan / ED Course  I have reviewed the triage vital signs and the nursing notes.  Pertinent labs & imaging results that were available during my care  of the patient were reviewed by me and considered in my medical decision making (see chart for details).        Patient to ED with continued left ear pain. NO fever, congestion, sore throat or allergy symptoms. No hearing loss.   The ear exam is completely normal. No redness or swelling of the external ear or ear canal. TM is clear. No middle ear effusion.   The patient can be discharged home. Suggested ibuprofen regularly and ENT follow up for persistent left ear pain without identifiable cause.   Final Clinical Impressions(s) / ED Diagnoses   Final diagnoses:  None   1. Left ear pain  ED Discharge Orders    None       Elpidio Anis, Cordelia Poche 10/24/18 0223    Glynn Octave, MD 10/24/18 [phone]

## 2018-10-30 ENCOUNTER — Other Ambulatory Visit: Payer: Self-pay

## 2018-10-30 ENCOUNTER — Encounter (HOSPITAL_COMMUNITY): Payer: Self-pay

## 2018-10-30 ENCOUNTER — Ambulatory Visit (HOSPITAL_COMMUNITY)
Admission: EM | Admit: 2018-10-30 | Discharge: 2018-10-30 | Disposition: A | Discharge status: Home / Self Care | Payer: Self-pay | Attending: Urgent Care | Admitting: Urgent Care

## 2018-10-30 DIAGNOSIS — R030 Elevated blood-pressure reading, without diagnosis of hypertension: Secondary | ICD-10-CM

## 2018-10-30 DIAGNOSIS — H9202 Otalgia, left ear: Secondary | ICD-10-CM

## 2018-10-30 MED ORDER — MUPIROCIN 2 % EX OINT: 1.0000 "application " | TOPICAL_OINTMENT | Freq: Three times a day (TID) | CUTANEOUS | 0 refills | Status: DC

## 2018-10-30 NOTE — ED Provider Notes (Signed)
MRN: [id number] DOB: May 05, 1986  Subjective:   [facility] is a 33 y.o. female presenting for several week history of ongoing left-sided burning type sensation of her left ear.  Patient is also worried that she has an insect or some type of foreign body inside of her ear.  Reports that her ear randomly bled yesterday.  She is undergone a course of amoxicillin followed by Augmentin in February.  She subsequently did a tele-visit and was prescribed clindamycin.  Denies having any other symptoms.  Regarding her blood pressure, patient reports that she is very anxious and worried about her symptoms.  She does not have a PCP.  She just applied for Medicaid.  Denies drug allergies. Denies pmh, psh.   Review of Systems  Constitutional: Negative for fever and malaise/fatigue.  HENT: Negative for congestion, sinus pain and sore throat.   Eyes: Negative for blurred vision, double vision, discharge and redness.  Respiratory: Negative for cough, hemoptysis, shortness of breath and wheezing.   Cardiovascular: Negative for chest pain.  Gastrointestinal: Negative for abdominal pain, diarrhea, nausea and vomiting.  Genitourinary: Negative for dysuria, flank pain and hematuria.  Musculoskeletal: Negative for myalgias.  Skin: Negative for rash.  Neurological: Negative for weakness and headaches.  Psychiatric/Behavioral: Negative for depression and substance abuse. The patient is nervous/anxious.     Objective:   Vitals: BP (!) 178/112 (BP Location: Right Arm)   Pulse 72   Temp 98.6 F (37 C) (Oral)   Resp 17   LMP 10/21/2018 Comment: currently on period per pt  SpO2 100%   Breastfeeding Unknown   Physical Exam Constitutional:      General: She is not in acute distress.    Appearance: She is well-developed. She is not ill-appearing.  HENT:     Head: Normocephalic and atraumatic.     Right Ear: Tympanic membrane and ear canal normal. No drainage or tenderness. No middle ear effusion. Tympanic  membrane is not erythematous.     Left Ear: Tympanic membrane and ear canal normal. No drainage or tenderness.  No middle ear effusion. Tympanic membrane is not erythematous.     Ears:      Nose: No congestion or rhinorrhea.     Mouth/Throat:     Mouth: Mucous membranes are moist. No oral lesions.     Pharynx: Oropharynx is clear. No pharyngeal swelling, oropharyngeal exudate, posterior oropharyngeal erythema or uvula swelling.     Tonsils: No tonsillar exudate or tonsillar abscesses.  Eyes:     Extraocular Movements:     Right eye: Normal extraocular motion.     Left eye: Normal extraocular motion.     Conjunctiva/sclera: Conjunctivae normal.     Pupils: Pupils are equal, round, and reactive to light.  Neck:     Musculoskeletal: Normal range of motion and neck supple.  Cardiovascular:     Rate and Rhythm: Normal rate.  Pulmonary:     Effort: Pulmonary effort is normal.  Lymphadenopathy:     Cervical: No cervical adenopathy.  Skin:    General: Skin is warm and dry.  Neurological:     General: No focal deficit present.     Mental Status: She is alert and oriented to person, place, and time.  Psychiatric:        Mood and Affect: Mood normal.        Behavior: Behavior normal.     Assessment and Plan :   Left ear pain  Patient reported tenderness significantly out of proportion to  physical exam findings.  I reassured patient that she did not have a foreign body, insect.  Counseled that she can continue her clindamycin and offered patient Bactroban to address a superficial skin infection.  She was agreeable to this.  Also counseled that she could use an antihistamine to try and address contact dermatitis versus ETD.  Recommended that she avoid frequently manipulating her ear as she was during her office visit.  Patient is to take Tylenol for any pain that she has.  She is to monitor her blood pressure at home. Counseled patient on potential for adverse effects with medications  prescribed today, patient verbalized understanding.  In light of her having undergone 3 rounds of antibiotics and mild physical exam findings, recommended that she try and contact ENT office should her symptoms persist.  ER and return-to-clinic precautions discussed, patient verbalized understanding.     Tracy BambergMani, Tracy Geffre, PA-C 10/30/18 1040

## 2018-10-30 NOTE — ED Triage Notes (Signed)
Patient presents to Urgent Care with complaints of left ear problems, including "something inside my ear, like scabs and blood and my cat had a weird infestation and I think one of those bugs got in there" since 2 days ago, but the ear problem has been ongoing.

## 2018-10-30 NOTE — Discharge Instructions (Addendum)
You may take 500mg  Tylenol every 6 hours for pain and inflammation.  Start an antihistamine like Zyrtec, Allegra or Claritin to address allergies, eustachian tube dysfunction as a source of your symptoms.  If your ear worsens, you can try to contact an Ear Nose Throat specialist (ENT).

## 2019-04-22 ENCOUNTER — Other Ambulatory Visit: Payer: Self-pay

## 2019-04-22 ENCOUNTER — Encounter (HOSPITAL_COMMUNITY): Payer: Self-pay | Admitting: Emergency Medicine

## 2019-04-22 ENCOUNTER — Observation Stay (HOSPITAL_COMMUNITY)
Admission: EM | Admit: 2019-04-22 | Discharge: 2019-04-23 | Disposition: A | Discharge status: Home / Self Care | Payer: Medicaid Other | Attending: Internal Medicine | Admitting: Internal Medicine

## 2019-04-22 DIAGNOSIS — T464X5A Adverse effect of angiotensin-converting-enzyme inhibitors, initial encounter: Secondary | ICD-10-CM | POA: Diagnosis not present

## 2019-04-22 DIAGNOSIS — I10 Essential (primary) hypertension: Secondary | ICD-10-CM

## 2019-04-22 DIAGNOSIS — Y849 Medical procedure, unspecified as the cause of abnormal reaction of the patient, or of later complication, without mention of misadventure at the time of the procedure: Secondary | ICD-10-CM | POA: Diagnosis not present

## 2019-04-22 DIAGNOSIS — Z20828 Contact with and (suspected) exposure to other viral communicable diseases: Secondary | ICD-10-CM | POA: Diagnosis not present

## 2019-04-22 DIAGNOSIS — T783XXA Angioneurotic edema, initial encounter: Secondary | ICD-10-CM

## 2019-04-22 DIAGNOSIS — Z888 Allergy status to other drugs, medicaments and biological substances status: Secondary | ICD-10-CM | POA: Insufficient documentation

## 2019-04-22 HISTORY — DX: Angioneurotic edema, initial encounter: T78.3XXA

## 2019-04-22 HISTORY — DX: Essential (primary) hypertension: I10

## 2019-04-22 LAB — CBC WITH DIFFERENTIAL/PLATELET
Abs Immature Granulocytes: 0.01 10*3/uL (ref 0.00–0.07)
Basophils Absolute: 0 10*3/uL (ref 0.0–0.1)
Basophils Relative: 1 %
Eosinophils Absolute: 0.1 10*3/uL (ref 0.0–0.5)
Eosinophils Relative: 1 %
HCT: 40.4 % (ref 36.0–46.0)
Hemoglobin: 13.2 g/dL (ref 12.0–15.0)
Immature Granulocytes: 0 %
Lymphocytes Relative: 44 %
Lymphs Abs: 2.9 10*3/uL (ref 0.7–4.0)
MCH: 30.5 pg (ref 26.0–34.0)
MCHC: 32.7 g/dL (ref 30.0–36.0)
MCV: 93.3 fL (ref 80.0–100.0)
Monocytes Absolute: 0.4 10*3/uL (ref 0.1–1.0)
Monocytes Relative: 7 %
Neutro Abs: 3.1 10*3/uL (ref 1.7–7.7)
Neutrophils Relative %: 47 %
Platelets: 329 10*3/uL (ref 150–400)
RBC: 4.33 MIL/uL (ref 3.87–5.11)
RDW: 14 % (ref 11.5–15.5)
WBC: 6.6 10*3/uL (ref 4.0–10.5)
nRBC: 0 % (ref 0.0–0.2)

## 2019-04-22 LAB — BASIC METABOLIC PANEL
Anion gap: 10 (ref 5–15)
BUN: 15 mg/dL (ref 6–20)
CO2: 24 mmol/L (ref 22–32)
Calcium: 9.4 mg/dL (ref 8.9–10.3)
Chloride: 105 mmol/L (ref 98–111)
Creatinine, Ser: 0.87 mg/dL (ref 0.44–1.00)
GFR calc Af Amer: >60 mL/min (ref >60)
GFR calc non Af Amer: >60 mL/min (ref >60)
Glucose, Bld: 93 mg/dL (ref 70–99)
Potassium: 3.9 mmol/L (ref 3.5–5.1)
Sodium: 139 mmol/L (ref 135–145)

## 2019-04-22 MED ORDER — DIPHENHYDRAMINE HCL 50 MG/ML IJ SOLN: 50.0000 mg | Freq: Every day | INTRAMUSCULAR | Status: DC

## 2019-04-22 MED ORDER — ACETAMINOPHEN 650 MG RE SUPP: 650.0000 mg | Freq: Four times a day (QID) | RECTAL | Status: DC | PRN

## 2019-04-22 MED ORDER — FAMOTIDINE IN NACL 20-0.9 MG/50ML-% IV SOLN
20.0000 mg | Freq: Two times a day (BID) | INTRAVENOUS | Status: DC
  Administered 2019-04-22 – 2019-04-23 (×2): 20 mg via INTRAVENOUS
  Filled 2019-04-22 (×3): qty 50

## 2019-04-22 MED ORDER — ONDANSETRON HCL 4 MG/2ML IJ SOLN: 4.0000 mg | Freq: Four times a day (QID) | INTRAMUSCULAR | Status: DC | PRN

## 2019-04-22 MED ORDER — METHYLPREDNISOLONE SODIUM SUCC 40 MG IJ SOLR
40.0000 mg | Freq: Two times a day (BID) | INTRAMUSCULAR | Status: DC
  Administered 2019-04-23: 40 mg via INTRAVENOUS
  Filled 2019-04-22: qty 1

## 2019-04-22 MED ORDER — WHITE PETROLATUM EX OINT
TOPICAL_OINTMENT | CUTANEOUS | Status: AC
  Administered 2019-04-22: 23:00:00
  Filled 2019-04-22: qty 28.35

## 2019-04-22 MED ORDER — ACETAMINOPHEN 325 MG PO TABS
650.0000 mg | ORAL_TABLET | Freq: Four times a day (QID) | ORAL | Status: DC | PRN
  Administered 2019-04-22: 650 mg via ORAL
  Filled 2019-04-22: qty 2

## 2019-04-22 MED ORDER — METHYLPREDNISOLONE SODIUM SUCC 125 MG IJ SOLR
125.0000 mg | Freq: Once | INTRAMUSCULAR | Status: AC
  Administered 2019-04-22: 125 mg via INTRAVENOUS
  Filled 2019-04-22: qty 2

## 2019-04-22 MED ORDER — ONDANSETRON HCL 4 MG PO TABS: 4.0000 mg | ORAL_TABLET | Freq: Four times a day (QID) | ORAL | Status: DC | PRN

## 2019-04-22 MED ORDER — DIPHENHYDRAMINE HCL 50 MG/ML IJ SOLN
50.0000 mg | Freq: Once | INTRAMUSCULAR | Status: AC
  Administered 2019-04-22: 50 mg via INTRAVENOUS
  Filled 2019-04-22: qty 1

## 2019-04-22 MED ORDER — DIPHENHYDRAMINE HCL 50 MG/ML IJ SOLN: 50.0000 mg | Freq: Four times a day (QID) | INTRAMUSCULAR | Status: DC | PRN

## 2019-04-22 NOTE — H&P (Signed)
History and Physical    Tracy Black GGY:[id number] DOB: 1985-12-10 DOA: 04/22/2019  PCP: Patient, No Pcp Per  Patient coming from: Home I have personally briefly reviewed patient's old medical records in [place]  Chief Complaint: Upper lip swelling since this morning  HPI: Tracy Black is a 33 y.o. female with medical history significant of hypertension presents to emergency department due to acute onset of upper lip swelling started 10 AM this morning.  Patient reports that she was started on lisinopril 10 mg once daily by urgent care physician 2-1/2 months ago.  This morning she noticed swelling of her upper lip-she took Allegra however her swelling started getting worse.  She denies association with shortness of breath, tongue swelling, throat swelling, rash, urticaria, nausea, vomiting, epigastric pain, joint pain, chest tightness.  She lives with her kids at home.  Denies smoking, illicit drug use, uses alcohol occasionally.  She denies previous history of allergic reaction, family history of angioedema/anaphylaxis.  ED Course: Upon arrival: Patient was tachycardic, blood pressure was slightly elevated.  Patient received loading dose of Solu-Medrol and Benadryl.  Basic labs such as CBC, CMP: WNL.  COVID-19: Pending.  Review of Systems: As per HPI otherwise negative.    History reviewed. No pertinent past medical history.  History reviewed. No pertinent surgical history.   reports that she has never smoked. She has never used smokeless tobacco. She reports that she does not drink alcohol or use drugs.  Allergies  Allergen Reactions  . Lisinopril     Lip swelling    History reviewed. No pertinent family history.  Prior to Admission medications   Medication Sig Start Date End Date Taking? Authorizing Provider  mupirocin ointment (BACTROBAN) 2 % Apply 1 application topically 3 (three) times daily. 10/30/18   Jaynee Eagles, PA-C    Physical Exam: Vitals:   04/22/19 1435  04/22/19 1446 04/22/19 1615 04/22/19 1630  BP: (!) 152/92  (!) 154/114 (!) 136/108  Pulse: 82  88 75  Resp: 16  (!) 25 14  Temp: 99 F (37.2 C)     TempSrc: Oral     SpO2: 93%  100% 100%  Weight:  70.3 kg    Height:  5\' 7"  (1.702 m)      Constitutional: NAD, calm, comfortable Vitals:   04/22/19 1435 04/22/19 1446 04/22/19 1615 04/22/19 1630  BP: (!) 152/92  (!) 154/114 (!) 136/108  Pulse: 82  88 75  Resp: 16  (!) 25 14  Temp: 99 F (37.2 C)     TempSrc: Oral     SpO2: 93%  100% 100%  Weight:  70.3 kg    Height:  5\' 7"  (1.702 m)     Constitutional: Alert and oriented x3, communicating well, not in acute distress.   Eyes: PERRL, lids and conjunctivae normal ENMT: Upper lip swollen, nonerythematous, mucous membranes are moist. Posterior pharynx clear of any exudate or lesions.uvula midline, normal dentition.     Neck: normal, supple, no masses, no thyromegaly Respiratory: clear to auscultation bilaterally, no wheezing, no crackles. Normal respiratory effort. No accessory muscle use.  Cardiovascular: Regular rate and rhythm, no murmurs / rubs / gallops. No extremity edema. 2+ pedal pulses. No carotid bruits.  Abdomen: no tenderness, no masses palpated. No hepatosplenomegaly. Bowel sounds positive.  Musculoskeletal: no clubbing / cyanosis. No joint deformity upper and lower extremities. Good ROM, no contractures. Normal muscle tone.  Skin: no rashes, lesions, ulcers. No induration Neurologic: CN 2-12 grossly intact. Sensation intact, DTR  normal. Strength 5/5 in all 4.  Psychiatric: Normal judgment and insight. Alert and oriented x 3. Normal mood.    Labs on Admission: I have personally reviewed following labs and imaging studies  CBC: Recent Labs  Lab 04/22/19 1611  WBC 6.6  NEUTROABS 3.1  HGB 13.2  HCT 40.4  MCV 93.3  PLT 329   Basic Metabolic Panel: Recent Labs  Lab 04/22/19 1611  NA 139  K 3.9  CL 105  CO2 24  GLUCOSE 93  BUN 15  CREATININE 0.87  CALCIUM  9.4   GFR: Estimated Creatinine Clearance: 91.9 mL/min (by C-G formula based on SCr of 0.87 mg/dL). Liver Function Tests: No results for input(s): AST, ALT, ALKPHOS, BILITOT, PROT, ALBUMIN in the last 168 hours. No results for input(s): LIPASE, AMYLASE in the last 168 hours. No results for input(s): AMMONIA in the last 168 hours. Coagulation Profile: No results for input(s): INR, PROTIME in the last 168 hours. Cardiac Enzymes: No results for input(s): CKTOTAL, CKMB, CKMBINDEX, TROPONINI in the last 168 hours. BNP (last 3 results) No results for input(s): PROBNP in the last 8760 hours. HbA1C: No results for input(s): HGBA1C in the last 72 hours. CBG: No results for input(s): GLUCAP in the last 168 hours. Lipid Profile: No results for input(s): CHOL, HDL, LDLCALC, TRIG, CHOLHDL, LDLDIRECT in the last 72 hours. Thyroid Function Tests: No results for input(s): TSH, T4TOTAL, FREET4, T3FREE, THYROIDAB in the last 72 hours. Anemia Panel: No results for input(s): VITAMINB12, FOLATE, FERRITIN, TIBC, IRON, RETICCTPCT in the last 72 hours. Urine analysis:    Component Value Date/Time   COLORURINE YELLOW 07/06/2018 1330   APPEARANCEUR HAZY (A) 07/06/2018 1330   LABSPEC 1.021 07/06/2018 1330   PHURINE 8.0 07/06/2018 1330   GLUCOSEU NEGATIVE 07/06/2018 1330   HGBUR NEGATIVE 07/06/2018 1330   BILIRUBINUR NEGATIVE 07/06/2018 1330   KETONESUR 20 (A) 07/06/2018 1330   PROTEINUR NEGATIVE 07/06/2018 1330   UROBILINOGEN 0.2 11/14/2010 1115   NITRITE NEGATIVE 07/06/2018 1330   LEUKOCYTESUR NEGATIVE 07/06/2018 1330    Radiological Exams on Admission: No results found.   Assessment/Plan Principal Problem:   Angio-edema Active Problems:   Essential hypertension    Angioedema: -Patient with apparent angioedema that is most likely associated with lisinopril. -She does not appear to have respiratory distress or urticaria -She does demonstrate marked upper lip swelling  without obvious  airway compromise at this time -She was treated in the ER with Benadryl and Solumedrol.  Continue Benadryl, Solu-Medrol and start on IV Pepcid. -Will observe overnight on the floor.  If her lip edema is improving, she is likely appropriate for d/c tomorrow. -Lisinopril has been added to her allergy list. -Because this appears to be drug-induced angioedema without urticaria, a C1 esterase inhibitor does not appear to be indicated at this time.  Hypertension: Blood pressure is currently stable. -Avoid lisinopril and other ACE inhibitor -Consider start on amlodipine if she continues to have elevated blood pressure. -Monitor blood pressure closely.  DVT prophylaxis: TED/SCD, early ambulation  code Status: Full code  family Communication: None present at bedside.  Plan of care discussed with patient in length and he verbalized understanding and agreed with it. Disposition Plan: Likely home tomorrow Consults called: None Admission status: Observation   Ollen Bowl MD [facility] Pager [phone]  If 7PM-7AM, please contact night-coverage [email or web address] Password [facility]  04/22/2019, 6:23 PM

## 2019-04-22 NOTE — ED Triage Notes (Signed)
Pt coming from home today states she was normal and then went to get a box of allegra and took one and that is when the swelling began. Pt denies trouble breathing and trouble swallowing. Pt unaware of any allergies.

## 2019-04-22 NOTE — ED Notes (Signed)
ED TO INPATIENT HANDOFF REPORT  ED Nurse Name and Phone #: Lovell Sheehan [phone]  S Name/Age/Gender Tracy Black 33 y.o. female Room/Bed: 009C/009C  Code Status   Code Status: Full Code  Home/SNF/Other Home Patient oriented to: self, place, time and situation Is this baseline? Yes   Triage Complete: Triage complete  Chief Complaint poss allergic rxn/lip swelling  Triage Note Pt coming from home today states she was normal and then went to get a box of allegra and took one and that is when the swelling began. Pt denies trouble breathing and trouble swallowing. Pt unaware of any allergies.   Allergies Allergies  Allergen Reactions  . Lisinopril     Lip swelling    Level of Care/Admitting Diagnosis ED Disposition    ED Disposition Condition [facility] Area: [place] [100100]  Level of Care: Telemetry Medical [104]  I expect the patient will be discharged within 24 hours: Yes  LOW acuity---Tx typically complete <24 hrs---ACUTE conditions typically can be evaluated <24 hours---LABS likely to return to acceptable levels <24 hours---IS near functional baseline---EXPECTED to return to current living arrangement---NOT newly hypoxic: Meets criteria for 5C-Observation unit  Covid Evaluation: Asymptomatic Screening Protocol (No Symptoms)  Diagnosis: Angio-edema [144315]  Admitting Physician: Mckinley Jewel [[id number]]  Attending Physician: Mckinley Jewel [phone]  PT Class (Do Not Modify): Observation [104]  PT Acc Code (Do Not Modify): Observation [10022]       B Medical/Surgery History History reviewed. No pertinent past medical history. History reviewed. No pertinent surgical history.   A IV Location/Drains/Wounds Patient Lines/Drains/Airways Status   Active Line/Drains/Airways    Name:   Placement date:   Placement time:   Site:   Days:   Peripheral IV 04/22/19 Right Antecubital   04/22/19    1600    Antecubital   less than 1           Intake/Output Last 24 hours No intake or output data in the 24 hours ending 04/22/19 1831  Labs/Imaging Results for orders placed or performed during the hospital encounter of 04/22/19 (from the past 48 hour(s))  Basic metabolic panel     Status: None   Collection Time: 04/22/19  4:11 PM  Result Value Ref Range   Sodium 139 135 - 145 mmol/L   Potassium 3.9 3.5 - 5.1 mmol/L   Chloride 105 98 - 111 mmol/L   CO2 24 22 - 32 mmol/L   Glucose, Bld 93 70 - 99 mg/dL   BUN 15 6 - 20 mg/dL   Creatinine, Ser 0.87 0.44 - 1.00 mg/dL   Calcium 9.4 8.9 - 10.3 mg/dL   GFR calc non Af Amer >60 >60 mL/min   GFR calc Af Amer >60 >60 mL/min   Anion gap 10 5 - 15    Comment: Performed at [facility], [place] [address], [place], [place] [address]  CBC with Differential     Status: None   Collection Time: 04/22/19  4:11 PM  Result Value Ref Range   WBC 6.6 4.0 - 10.5 K/uL   RBC 4.33 3.87 - 5.11 MIL/uL   Hemoglobin 13.2 12.0 - 15.0 g/dL   HCT 40.4 36.0 - 46.0 %   MCV 93.3 80.0 - 100.0 fL   MCH 30.5 26.0 - 34.0 pg   MCHC 32.7 30.0 - 36.0 g/dL   RDW 14.0 11.5 - 15.5 %   Platelets 329 150 - 400 K/uL   nRBC 0.0 0.0 -  0.2 %   Neutrophils Relative % 47 %   Neutro Abs 3.1 1.7 - 7.7 K/uL   Lymphocytes Relative 44 %   Lymphs Abs 2.9 0.7 - 4.0 K/uL   Monocytes Relative 7 %   Monocytes Absolute 0.4 0.1 - 1.0 K/uL   Eosinophils Relative 1 %   Eosinophils Absolute 0.1 0.0 - 0.5 K/uL   Basophils Relative 1 %   Basophils Absolute 0.0 0.0 - 0.1 K/uL   Immature Granulocytes 0 %   Abs Immature Granulocytes 0.01 0.00 - 0.07 K/uL    Comment: Performed at [facility], [address] [place], [place] [address]   No results found.  Pending Labs Unresulted Labs (From admission, onward)    Start     Ordered   04/23/19 0500  Basic metabolic panel  Tomorrow morning,   R     04/22/19 1805   04/23/19 0500  CBC  Tomorrow morning,   R     04/22/19 1805   04/22/19 1803  HIV Antibody (routine  testing w rflx)  (HIV Antibody (Routine testing w reflex) panel)  Once,   STAT     04/22/19 1805   04/22/19 1803  HIV4GL Save Tube  (HIV Antibody (Routine testing w reflex) panel)  Once,   STAT     04/22/19 1805   04/22/19 1709  Novel Coronavirus, NAA (Hosp order, Send-out to Ref Lab; TAT 18-24 hrs  (Asymptomatic/Tier 3)  Once,   STAT    Question Answer Comment  Is this test for diagnosis or screening Screening   Symptomatic for COVID-19 as defined by CDC No   Hospitalized for COVID-19 No   Admitted to ICU for COVID-19 No   Previously tested for COVID-19 No   Resident in a congregate (group) care setting No   Employed in healthcare setting No   Pregnant No      04/22/19 1709          Vitals/Pain Today's Vitals   04/22/19 1435 04/22/19 1446 04/22/19 1615 04/22/19 1630  BP: (!) 152/92  (!) 154/114 (!) 136/108  Pulse: 82  88 75  Resp: 16  (!) 25 14  Temp: 99 F (37.2 C)     TempSrc: Oral     SpO2: 93%  100% 100%  Weight:  70.3 kg    Height:  5\' 7"  (1.702 m)    PainSc:  0-No pain      Isolation Precautions No active isolations  Medications Medications  acetaminophen (TYLENOL) tablet 650 mg (has no administration in time range)    Or  acetaminophen (TYLENOL) suppository 650 mg (has no administration in time range)  ondansetron (ZOFRAN) tablet 4 mg (has no administration in time range)    Or  ondansetron (ZOFRAN) injection 4 mg (has no administration in time range)  famotidine (PEPCID) IVPB 20 mg premix (has no administration in time range)  methylPREDNISolone sodium succinate (SOLU-MEDROL) 40 mg/mL injection 40 mg (has no administration in time range)  diphenhydrAMINE (BENADRYL) injection 50 mg (has no administration in time range)  diphenhydrAMINE (BENADRYL) injection 50 mg (50 mg Intravenous Given 04/22/19 1604)  methylPREDNISolone sodium succinate (SOLU-MEDROL) 125 mg/2 mL injection 125 mg (125 mg Intravenous Given 04/22/19 1605)    Mobility walks Low fall risk    Focused Assessment    R Recommendations: See Admitting Provider Note  Report given to:   Additional Notes:

## 2019-04-22 NOTE — ED Notes (Signed)
Pt provided with ice pack per request "I feel like my lip is going to burst."

## 2019-04-22 NOTE — ED Provider Notes (Signed)
[facility] EMERGENCY DEPARTMENT Provider Note   CSN: [id number] Arrival date & time: 04/22/19  1431     History   Chief Complaint Chief Complaint  Patient presents with  . Facial Swelling    Lip swelling    HPI Tracy Black is a 33 y.o. female who presents to the ED today complaining of sudden onset, constant, worsening, upper lip swelling that began this morning. Pt reports she went to the store to get some Allegra and took it but reports the swelling has only gotten worse. No new foods, medications, hygiene products. Pt does have a hx of HTN and states she takes a blood pressure med but cannot give me the name of the medication. Denies shortness of breath, throat swelling, tongue swelling, hives, nausea, vomiting, or any other associated symptoms.       History reviewed. No pertinent past medical history.  Patient Active Problem List   Diagnosis Date Noted  . Angio-edema 04/22/2019  . Essential hypertension 04/22/2019    History reviewed. No pertinent surgical history.   OB History    Gravida  1   Para      Term      Preterm      AB      Living        SAB      TAB      Ectopic      Multiple      Live Births               Home Medications    Prior to Admission medications   Not on File    Family History History reviewed. No pertinent family history.  Social History Social History   Tobacco Use  . Smoking status: Never Smoker  . Smokeless tobacco: Never Used  Substance Use Topics  . Alcohol use: No  . Drug use: No     Allergies   Lisinopril   Review of Systems Review of Systems   Physical Exam Updated Vital Signs BP (!) 152/92 (BP Location: Right Arm)   Pulse 82   Temp 99 F (37.2 C) (Oral)   Resp 16   Ht 5\' 7"  (1.702 m)   Wt 70.3 kg   SpO2 93%   BMI 24.28 kg/m   Physical Exam Vitals signs and nursing note reviewed.  Constitutional:      Appearance: She is not ill-appearing.  HENT:   Head: Normocephalic and atraumatic.     Right Ear: Tympanic membrane normal.     Left Ear: Tympanic membrane normal.     Mouth/Throat:     Mouth: Mucous membranes are moist.     Comments: Angioedema noted to left upper lip. No tongue or posterior oropharyngeal swelling noted. Uvula is midline.  Eyes:     Extraocular Movements: Extraocular movements intact.     Conjunctiva/sclera: Conjunctivae normal.     Pupils: Pupils are equal, round, and reactive to light.  Neck:     Musculoskeletal: Neck supple.  Cardiovascular:     Rate and Rhythm: Normal rate and regular rhythm.  Pulmonary:     Effort: Pulmonary effort is normal.     Breath sounds: Normal breath sounds. No wheezing, rhonchi or rales.     Comments: Respirations unlabored. LCTAB Abdominal:     Palpations: Abdomen is soft.     Tenderness: There is no abdominal tenderness. There is no guarding or rebound.  Skin:    General: Skin is warm and dry.  Neurological:     Mental Status: She is alert.         ED Treatments / Results  Labs (all labs ordered are listed, but only abnormal results are displayed) Labs Reviewed  NOVEL CORONAVIRUS, NAA (HOSP ORDER, SEND-OUT TO REF LAB; TAT 18-24 HRS)  BASIC METABOLIC PANEL  CBC WITH DIFFERENTIAL/PLATELET  HIV ANTIBODY (ROUTINE TESTING W REFLEX)  HIV4GL SAVE TUBE  BASIC METABOLIC PANEL  CBC    EKG None  Radiology No results found.  Procedures Procedures (including critical care time)  Medications Ordered in ED Medications  acetaminophen (TYLENOL) tablet 650 mg (has no administration in time range)    Or  acetaminophen (TYLENOL) suppository 650 mg (has no administration in time range)  ondansetron (ZOFRAN) tablet 4 mg (has no administration in time range)    Or  ondansetron (ZOFRAN) injection 4 mg (has no administration in time range)  famotidine (PEPCID) IVPB 20 mg premix (has no administration in time range)  methylPREDNISolone sodium succinate (SOLU-MEDROL) 40 mg/mL  injection 40 mg (has no administration in time range)  diphenhydrAMINE (BENADRYL) injection 50 mg (has no administration in time range)  diphenhydrAMINE (BENADRYL) injection 50 mg (50 mg Intravenous Given 04/22/19 1604)  methylPREDNISolone sodium succinate (SOLU-MEDROL) 125 mg/2 mL injection 125 mg (125 mg Intravenous Given 04/22/19 1605)     Initial Impression / Assessment and Plan / ED Course  I have reviewed the triage vital signs and the nursing notes.  Pertinent labs & imaging results that were available during my care of the patient were reviewed by me and considered in my medical decision making (see chart for details).    33 year old female who presents to the ED today complaining of lip swelling.  She was initially triaged as a acuity 4.  Per triage report it stated that patient started having lip swelling after taking Allegra today.  During my examination she reports lip swelling started this morning.  When asked about any medication she reported taking a blood pressure medication that she could not recall the name of.  Unable to visualize this in our system.  Called pharmacy while I was in the room and they reported patient taking 10 mg lisinopril.  She reports that started taking this 2 months ago.  Believe patient is having angioedema from ACE inhibitor.  Advised to stop offending agent immediately.  I have discussed this case with attending physician Dr. Renaye Rakers who recommends Benadryl and Solu-Medrol as well as baseline blood work. Will continue to monitor in the ED to ensure no worsening symptoms.  Pt monitored in the ED for 3 hours. She reports a "tingling sensation in her throat." Upon reevaluation there does not appear to be any oropharyngeal edema but given worsening sx feel pt would benefit from admission. Will consult hospitalist at this time and swab for covid.   Discussed case with Dr.Pahwani who agrees to accept patient for admission.   This note was prepared using Dragon  voice recognition software and may include unintentional dictation errors due to the inherent limitations of voice recognition software.       Final Clinical Impressions(s) / ED Diagnoses   Final diagnoses:  Angioedema, initial encounter    ED Discharge Orders    None       Tanda Rockers, PA-C 04/22/19 1857    Terald Sleeper, MD 04/22/19 [phone]

## 2019-04-23 DIAGNOSIS — I10 Essential (primary) hypertension: Secondary | ICD-10-CM

## 2019-04-23 DIAGNOSIS — T783XXA Angioneurotic edema, initial encounter: Secondary | ICD-10-CM | POA: Diagnosis not present

## 2019-04-23 LAB — BASIC METABOLIC PANEL
Anion gap: 12 (ref 5–15)
BUN: 14 mg/dL (ref 6–20)
CO2: 22 mmol/L (ref 22–32)
Calcium: 9.3 mg/dL (ref 8.9–10.3)
Chloride: 102 mmol/L (ref 98–111)
Creatinine, Ser: 0.83 mg/dL (ref 0.44–1.00)
GFR calc Af Amer: >60 mL/min (ref >60)
GFR calc non Af Amer: >60 mL/min (ref >60)
Glucose, Bld: 164 mg/dL — ABNORMAL HIGH (ref 70–99)
Potassium: 3.7 mmol/L (ref 3.5–5.1)
Sodium: 136 mmol/L (ref 135–145)

## 2019-04-23 LAB — NOVEL CORONAVIRUS, NAA (HOSP ORDER, SEND-OUT TO REF LAB; TAT 18-24 HRS): SARS-CoV-2, NAA: NOT DETECTED

## 2019-04-23 LAB — CBC
HCT: 40.6 % (ref 36.0–46.0)
Hemoglobin: 13.4 g/dL (ref 12.0–15.0)
MCH: 30.5 pg (ref 26.0–34.0)
MCHC: 33 g/dL (ref 30.0–36.0)
MCV: 92.3 fL (ref 80.0–100.0)
Platelets: 308 10*3/uL (ref 150–400)
RBC: 4.4 MIL/uL (ref 3.87–5.11)
RDW: 14 % (ref 11.5–15.5)
WBC: 9.6 10*3/uL (ref 4.0–10.5)
nRBC: 0 % (ref 0.0–0.2)

## 2019-04-23 LAB — HIV ANTIBODY (ROUTINE TESTING W REFLEX): HIV Screen 4th Generation wRfx: NONREACTIVE

## 2019-04-23 MED ORDER — PREDNISONE 20 MG PO TABS: 40.0000 mg | ORAL_TABLET | Freq: Every day | ORAL | 0 refills | Status: DC

## 2019-04-23 MED ORDER — AMLODIPINE BESYLATE 5 MG PO TABS
5.0000 mg | ORAL_TABLET | Freq: Every day | ORAL | Status: DC
  Administered 2019-04-23: 5 mg via ORAL
  Filled 2019-04-23: qty 1

## 2019-04-23 MED ORDER — FAMOTIDINE 20 MG PO TABS: 20.0000 mg | ORAL_TABLET | Freq: Two times a day (BID) | ORAL | 1 refills | Status: DC

## 2019-04-23 MED ORDER — DIPHENHYDRAMINE HCL 50 MG PO TABS: 50.0000 mg | ORAL_TABLET | Freq: Four times a day (QID) | ORAL | 0 refills | Status: DC | PRN

## 2019-04-23 MED ORDER — AMLODIPINE BESYLATE 10 MG PO TABS: 5.0000 mg | ORAL_TABLET | Freq: Every day | ORAL | 1 refills | Status: DC

## 2019-04-23 NOTE — Discharge Instructions (Signed)
DASH Eating Plan °DASH stands for "Dietary Approaches to Stop Hypertension." The DASH eating plan is a healthy eating plan that has been shown to reduce high blood pressure (hypertension). It may also reduce your risk for type 2 diabetes, heart disease, and stroke. The DASH eating plan may also help with weight loss. °What are tips for following this plan? ° °General guidelines °· Avoid eating more than 2,300 mg (milligrams) of salt (sodium) a day. If you have hypertension, you may need to reduce your sodium intake to 1,500 mg a day. °· Limit alcohol intake to no more than 1 drink a day for nonpregnant women and 2 drinks a day for men. One drink equals 12 oz of beer, 5 oz of wine, or 1½ oz of hard liquor. °· Work with your health care provider to maintain a healthy body weight or to lose weight. Ask what an ideal weight is for you. °· Get at least 30 minutes of exercise that causes your heart to beat faster (aerobic exercise) most days of the week. Activities may include walking, swimming, or biking. °· Work with your health care provider or diet and nutrition specialist (dietitian) to adjust your eating plan to your individual calorie needs. °Reading food labels ° °· Check food labels for the amount of sodium per serving. Choose foods with less than 5 percent of the Daily Value of sodium. Generally, foods with less than 300 mg of sodium per serving fit into this eating plan. °· To find whole grains, look for the word "whole" as the first word in the ingredient list. °Shopping °· Buy products labeled as "low-sodium" or "no salt added." °· Buy fresh foods. Avoid canned foods and premade or frozen meals. °Cooking °· Avoid adding salt when cooking. Use salt-free seasonings or herbs instead of table salt or sea salt. Check with your health care provider or pharmacist before using salt substitutes. °· Do not fry foods. Cook foods using healthy methods such as baking, boiling, grilling, and broiling instead. °· Cook with  heart-healthy oils, such as olive, canola, soybean, or sunflower oil. °Meal planning °· Eat a balanced diet that includes: °? 5 or more servings of fruits and vegetables each day. At each meal, try to fill half of your plate with fruits and vegetables. °? Up to 6-8 servings of whole grains each day. °? Less than 6 oz of lean meat, poultry, or fish each day. A 3-oz serving of meat is about the same size as a deck of cards. One egg equals 1 oz. °? 2 servings of low-fat dairy each day. °? A serving of nuts, seeds, or beans 5 times each week. °? Heart-healthy fats. Healthy fats called Omega-3 fatty acids are found in foods such as flaxseeds and coldwater fish, like sardines, salmon, and mackerel. °· Limit how much you eat of the following: °? Canned or prepackaged foods. °? Food that is high in trans fat, such as fried foods. °? Food that is high in saturated fat, such as fatty meat. °? Sweets, desserts, sugary drinks, and other foods with added sugar. °? Full-fat dairy products. °· Do not salt foods before eating. °· Try to eat at least 2 vegetarian meals each week. °· Eat more home-cooked food and less restaurant, buffet, and fast food. °· When eating at a restaurant, ask that your food be prepared with less salt or no salt, if possible. °What foods are recommended? °The items listed may not be a complete list. Talk with your dietitian about   what dietary choices are best for you. °Grains °Whole-grain or whole-wheat bread. Whole-grain or whole-wheat pasta. [place] rice. Oatmeal. Quinoa. Bulgur. Whole-grain and low-sodium cereals. Pita bread. Low-fat, low-sodium crackers. Whole-wheat flour tortillas. °Vegetables °Fresh or frozen vegetables (raw, steamed, roasted, or grilled). Low-sodium or reduced-sodium tomato and vegetable juice. Low-sodium or reduced-sodium tomato sauce and tomato paste. Low-sodium or reduced-sodium canned vegetables. °Fruits °All fresh, dried, or frozen fruit. Canned fruit in natural juice (without  added sugar). °Meat and other protein foods °Skinless chicken or turkey. Ground chicken or turkey. Pork with fat trimmed off. Fish and seafood. Egg whites. Dried beans, peas, or lentils. Unsalted nuts, nut butters, and seeds. Unsalted canned beans. Lean cuts of beef with fat trimmed off. Low-sodium, lean deli meat. °Dairy °Low-fat (1%) or fat-free (skim) milk. Fat-free, low-fat, or reduced-fat cheeses. Nonfat, low-sodium ricotta or cottage cheese. Low-fat or nonfat yogurt. Low-fat, low-sodium cheese. °Fats and oils °Soft margarine without trans fats. Vegetable oil. Low-fat, reduced-fat, or light mayonnaise and salad dressings (reduced-sodium). Canola, safflower, olive, soybean, and sunflower oils. Avocado. °Seasoning and other foods °Herbs. Spices. Seasoning mixes without salt. Unsalted popcorn and pretzels. Fat-free sweets. °What foods are not recommended? °The items listed may not be a complete list. Talk with your dietitian about what dietary choices are best for you. °Grains °Baked goods made with fat, such as croissants, muffins, or some breads. Dry pasta or rice meal packs. °Vegetables °Creamed or fried vegetables. Vegetables in a cheese sauce. Regular canned vegetables (not low-sodium or reduced-sodium). Regular canned tomato sauce and paste (not low-sodium or reduced-sodium). Regular tomato and vegetable juice (not low-sodium or reduced-sodium). Pickles. Olives. °Fruits °Canned fruit in a light or heavy syrup. Fried fruit. Fruit in cream or butter sauce. °Meat and other protein foods °Fatty cuts of meat. Ribs. Fried meat. Bacon. Sausage. Bologna and other processed lunch meats. Salami. Fatback. Hotdogs. Bratwurst. Salted nuts and seeds. Canned beans with added salt. Canned or smoked fish. Whole eggs or egg yolks. Chicken or turkey with skin. °Dairy °Whole or 2% milk, cream, and half-and-half. Whole or full-fat cream cheese. Whole-fat or sweetened yogurt. Full-fat cheese. Nondairy creamers. Whipped toppings.  Processed cheese and cheese spreads. °Fats and oils °Butter. Stick margarine. Lard. Shortening. Ghee. Bacon fat. Tropical oils, such as coconut, palm kernel, or palm oil. °Seasoning and other foods °Salted popcorn and pretzels. Onion salt, garlic salt, seasoned salt, table salt, and sea salt. Worcestershire sauce. Tartar sauce. Barbecue sauce. Teriyaki sauce. Soy sauce, including reduced-sodium. Steak sauce. Canned and packaged gravies. Fish sauce. Oyster sauce. Cocktail sauce. Horseradish that you find on the shelf. Ketchup. Mustard. Meat flavorings and tenderizers. Bouillon cubes. Hot sauce and Tabasco sauce. Premade or packaged marinades. Premade or packaged taco seasonings. Relishes. Regular salad dressings. °Where to find more information: °· National Heart, Lung, and Blood Institute: www.nhlbi.nih.gov °· American Heart Association: www.heart.org °Summary °· The DASH eating plan is a healthy eating plan that has been shown to reduce high blood pressure (hypertension). It may also reduce your risk for type 2 diabetes, heart disease, and stroke. °· With the DASH eating plan, you should limit salt (sodium) intake to 2,300 mg a day. If you have hypertension, you may need to reduce your sodium intake to 1,500 mg a day. °· When on the DASH eating plan, aim to eat more fresh fruits and vegetables, whole grains, lean proteins, low-fat dairy, and heart-healthy fats. °· Work with your health care provider or diet and nutrition specialist (dietitian) to adjust your eating plan to your   individual calorie needs. This information is not intended to replace advice given to you by your health care provider. Make sure you discuss any questions you have with your health care provider. Document Released: 06/25/2011 Document Revised: 06/18/2017 Document Reviewed: 06/29/2016 Elsevier Patient Education  2020 [place] were cared for by a hospitalist during your hospital stay. If you have any questions about your  discharge medications or the care you received while you were in the hospital after you are discharged, you can call the unit and asked to speak with the hospitalist on call if the hospitalist that took care of you is not available. Once you are discharged, your primary care physician will handle any further medical issues.   Please note that NO REFILLS for any discharge medications will be authorized once you are discharged, as it is imperative that you return to your primary care physician (or establish a relationship with a primary care physician if you do not have one) for your aftercare needs so that they can reassess your need for medications and monitor your lab values.  Please take all your medications with you for your next visit with your Primary MD. Please ask your Primary MD to get all Hospital records sent to his/her office. Please request your Primary MD to go over all hospital test results at the follow up.   If you experience worsening of your admission symptoms, develop shortness of breath, chest pain, suicidal or homicidal thoughts or a life threatening emergency, you must seek medical attention immediately by calling 911 or calling your MD.   Dennis Bast must read the complete instructions/literature along with all the possible adverse reactions/side effects for all the medicines you take including new medications that have been prescribed to you. Take new medicines after you have completely understood and accpet all the possible adverse reactions/side effects.    Do not drive when taking pain medications or sedatives.     Do not take more than prescribed Pain, Sleep and Anxiety Medications   If you have smoked or chewed Tobacco in the last 2 yrs please stop. Stop any regular alcohol  and or recreational drug use.   Wear Seat belts while driving.

## 2019-04-23 NOTE — Discharge Summary (Signed)
Physician Discharge Summary  Tracy Black SWF:[id number] DOB: [birth year] DOA: 04/22/2019  PCP: Patient, No Pcp Per  Admit date: 04/22/2019 Discharge date: 04/23/2019  Admitted From: home Disposition:  home   Recommendations for Outpatient Follow-up:  1. Avoid ACE I and ARB  Discharge Condition:  stable   CODE STATUS:  Full code   Diet recommendation:  Heart healthy Consultations:  none    Discharge Diagnoses:  Principal Problem:   Angio-edema Active Problems:   Essential hypertension      Brief Summary: Tracy Black is a 33 y.o. female with medical history significant of hypertension presents to emergency department due to acute onset of upper lip swelling started 10 AM this morning.  Patient reports that she was started on lisinopril 10 mg once daily by urgent care physician 2-1/2 months ago.  This morning she noticed swelling of her upper lip-she took Allegra however her swelling started getting worse.  She denies association with shortness of breath, tongue swelling, throat swelling, rash, urticaria, nausea, vomiting, epigastric pain, joint pain, chest tightness.  Hospital Course:   Angioedema - secondary to Lisinopril- resolving with steroids, Benadryl and Pepcid- Will prescribe Prednisone and continue Benadryl and Pepcid - she has been advised to avoid ACE and ARB  HTN - have started Amlodipine for this - advised to f/u with PCP in a couple of weeks   Discharge Exam: Vitals:   04/23/19 1015 04/23/19 1111  BP: (!) 155/111 (!) 158/107  Pulse: 88 74  Resp: 17   Temp:    SpO2:     Vitals:   04/22/19 2118 04/23/19 0641 04/23/19 1015 04/23/19 1111  BP: (!) 142/102 (!) 147/98 (!) 155/111 (!) 158/107  Pulse: 81 73 88 74  Resp: 18 16 17    Temp: 97.8 F (36.6 C) 97.9 F (36.6 C)    TempSrc: Oral Oral    SpO2: 100% 98%    Weight:      Height:        General: Pt is alert, awake, not in acute distress Cardiovascular: RRR, S1/S2 +, no rubs, no  gallops Respiratory: CTA bilaterally, no wheezing, no rhonchi Abdominal: Soft, NT, ND, bowel sounds + Extremities: no edema, no cyanosis   Discharge Instructions  Discharge Instructions    Diet - low sodium heart healthy   Complete by: As directed    Discharge instructions   Complete by: As directed    Avoid all ACE inhibitors and ARBs.   Increase activity slowly   Complete by: As directed      Allergies as of 04/23/2019      Reactions   Lisinopril    Lip swelling      Medication List    TAKE these medications   amLODipine 10 MG tablet Commonly known as: NORVASC Take 0.5 tablets (5 mg total) by mouth daily.   diphenhydrAMINE 50 MG tablet Commonly known as: BENADRYL Take 1 tablet (50 mg total) by mouth every 6 (six) hours as needed for allergies (swelling).   famotidine 20 MG tablet Commonly known as: PEPCID Take 1 tablet (20 mg total) by mouth 2 (two) times daily.   predniSONE 20 MG tablet Commonly known as: DELTASONE Take 2 tablets (40 mg total) by mouth daily.       Allergies  Allergen Reactions  . Lisinopril     Lip swelling     Procedures/Studies:    No results found.   The results of significant diagnostics from this hospitalization (including imaging, microbiology, ancillary and laboratory) are  listed below for reference.     Microbiology: No results found for this or any previous visit (from the past 240 hour(s)).   Labs: BNP (last 3 results) No results for input(s): BNP in the last 8760 hours. Basic Metabolic Panel: Recent Labs  Lab 04/22/19 1611 04/23/19 0220  NA 139 136  K 3.9 3.7  CL 105 102  CO2 24 22  GLUCOSE 93 164*  BUN 15 14  CREATININE 0.87 0.83  CALCIUM 9.4 9.3   Liver Function Tests: No results for input(s): AST, ALT, ALKPHOS, BILITOT, PROT, ALBUMIN in the last 168 hours. No results for input(s): LIPASE, AMYLASE in the last 168 hours. No results for input(s): AMMONIA in the last 168 hours. CBC: Recent Labs  Lab  04/22/19 1611 04/23/19 0220  WBC 6.6 9.6  NEUTROABS 3.1  --   HGB 13.2 13.4  HCT 40.4 40.6  MCV 93.3 92.3  PLT 329 308   Cardiac Enzymes: No results for input(s): CKTOTAL, CKMB, CKMBINDEX, TROPONINI in the last 168 hours. BNP: Invalid input(s): POCBNP CBG: No results for input(s): GLUCAP in the last 168 hours. D-Dimer No results for input(s): DDIMER in the last 72 hours. Hgb A1c No results for input(s): HGBA1C in the last 72 hours. Lipid Profile No results for input(s): CHOL, HDL, LDLCALC, TRIG, CHOLHDL, LDLDIRECT in the last 72 hours. Thyroid function studies No results for input(s): TSH, T4TOTAL, T3FREE, THYROIDAB in the last 72 hours.  Invalid input(s): FREET3 Anemia work up No results for input(s): VITAMINB12, FOLATE, FERRITIN, TIBC, IRON, RETICCTPCT in the last 72 hours. Urinalysis    Component Value Date/Time   COLORURINE YELLOW 07/06/2018 1330   APPEARANCEUR HAZY (A) 07/06/2018 1330   LABSPEC 1.021 07/06/2018 1330   PHURINE 8.0 07/06/2018 1330   GLUCOSEU NEGATIVE 07/06/2018 1330   HGBUR NEGATIVE 07/06/2018 1330   BILIRUBINUR NEGATIVE 07/06/2018 1330   KETONESUR 20 (A) 07/06/2018 1330   PROTEINUR NEGATIVE 07/06/2018 1330   UROBILINOGEN 0.2 11/14/2010 1115   NITRITE NEGATIVE 07/06/2018 1330   LEUKOCYTESUR NEGATIVE 07/06/2018 1330   Sepsis Labs Invalid input(s): PROCALCITONIN,  WBC,  LACTICIDVEN Microbiology No results found for this or any previous visit (from the past 240 hour(s)).   Time coordinating discharge in minutes: 55  SIGNED:   Debbe Odea, MD  [facility] 04/23/2019, 11:44 AM Pager   If 7PM-7AM, please contact night-coverage [email or web address] Password [email or web address]

## 2019-04-23 NOTE — Progress Notes (Signed)
Pt discharge eduction went over at bedside  Pt has all belongings Pt IV removed, catheter intact and telemetry removed  Education provided on new medications and DASH diet Pt verbalizes understanding  Pt refused wheelchair  Pt ambulated off the unit with RN

## 2019-06-02 ENCOUNTER — Emergency Department (HOSPITAL_COMMUNITY): Payer: Medicaid Other

## 2019-06-02 ENCOUNTER — Other Ambulatory Visit: Payer: Self-pay

## 2019-06-02 ENCOUNTER — Emergency Department (HOSPITAL_COMMUNITY)
Admission: EM | Admit: 2019-06-02 | Discharge: 2019-06-02 | Disposition: A | Discharge status: Home / Self Care | Payer: Medicaid Other | Attending: Emergency Medicine | Admitting: Emergency Medicine

## 2019-06-02 ENCOUNTER — Encounter (HOSPITAL_COMMUNITY): Payer: Self-pay | Admitting: Emergency Medicine

## 2019-06-02 DIAGNOSIS — R221 Localized swelling, mass and lump, neck: Secondary | ICD-10-CM | POA: Diagnosis present

## 2019-06-02 DIAGNOSIS — I10 Essential (primary) hypertension: Secondary | ICD-10-CM | POA: Diagnosis not present

## 2019-06-02 DIAGNOSIS — K115 Sialolithiasis: Secondary | ICD-10-CM | POA: Diagnosis not present

## 2019-06-02 HISTORY — DX: Essential (primary) hypertension: I10

## 2019-06-02 MED ORDER — DEXAMETHASONE 4 MG PO TABS
10.0000 mg | ORAL_TABLET | Freq: Once | ORAL | Status: AC
  Administered 2019-06-02: 10 mg via ORAL
  Filled 2019-06-02: qty 2

## 2019-06-02 NOTE — ED Provider Notes (Signed)
[facility] DEPT Provider Note   CSN: [id number] Arrival date & time: 06/02/19  0137     History   Chief Complaint Chief Complaint  Patient presents with  . Allergic Reaction    HPI Tracy Black is a 33 y.o. female.     Patient to ED with complaint of painful swelling to the left side of her neck and under her jaw. It started at the base of the ear last night. She feel the swelling makes it hard for her to swallow. No lip or tongue swelling. No rash or SOB. She expresses concern that symptoms are a reaction to medication she started weeks ago.   The history is provided by the patient. No language interpreter was used.    Past Medical History:  Diagnosis Date  . Hypertension     Patient Active Problem List   Diagnosis Date Noted  . Angio-edema 04/22/2019  . Essential hypertension 04/22/2019    History reviewed. No pertinent surgical history.   OB History    Gravida  1   Para      Term      Preterm      AB      Living        SAB      TAB      Ectopic      Multiple      Live Births               Home Medications    Prior to Admission medications   Medication Sig Start Date End Date Taking? Authorizing Provider  amLODipine (NORVASC) 10 MG tablet Take 0.5 tablets (5 mg total) by mouth daily. 04/23/19 06/02/19 Yes Debbe Odea, MD  famotidine (PEPCID) 20 MG tablet Take 1 tablet (20 mg total) by mouth 2 (two) times daily. 04/23/19 04/22/20 Yes Debbe Odea, MD  fexofenadine (ALLEGRA) 180 MG tablet Take 180 mg by mouth once.   Yes [provider]  diphenhydrAMINE (BENADRYL) 50 MG tablet Take 1 tablet (50 mg total) by mouth every 6 (six) hours as needed for allergies (swelling). Patient not taking: Reported on 06/02/2019 04/23/19   Debbe Odea, MD  predniSONE (DELTASONE) 20 MG tablet Take 2 tablets (40 mg total) by mouth daily. Patient not taking: Reported on 06/02/2019 04/23/19   Debbe Odea, MD     Family History History reviewed. No pertinent family history.  Social History Social History   Tobacco Use  . Smoking status: Never Smoker  . Smokeless tobacco: Never Used  Substance Use Topics  . Alcohol use: No  . Drug use: No     Allergies   Lisinopril   Review of Systems Review of Systems  Constitutional: Negative for fever.  HENT: Positive for ear pain and trouble swallowing. Negative for sore throat.        See HPI.  Eyes: Negative for visual disturbance.  Respiratory: Negative for shortness of breath.   Cardiovascular: Negative for chest pain.  Gastrointestinal: Negative for nausea.  Musculoskeletal: Positive for neck pain (See HPI.). Negative for neck stiffness.  Skin: Negative for wound.  Neurological: Negative for headaches.     Physical Exam Updated Vital Signs BP (!) 138/91 (BP Location: Left Arm)   Pulse (!) 110   Temp 99 F (37.2 C) (Oral)   Resp (!) 22   Ht 5\' 7"  (1.702 m)   Wt 70.3 kg   LMP 05/27/2019   SpO2 99%   BMI 24.28 kg/m   Physical  Exam HENT:     Head: Normocephalic.     Left Ear: Tympanic membrane normal.     Nose: Nose normal.     Mouth/Throat:     Mouth: Mucous membranes are moist.     Pharynx: No oropharyngeal exudate or posterior oropharyngeal erythema.     Comments: Good dentition without visualized abscess.  Eyes:     Conjunctiva/sclera: Conjunctivae normal.  Neck:      Comments: Tender, firm swelling to left submandibular proximal jaw. Tender to immediate post- and pre-auricular ear without swelling. No redness.  Pulmonary:     Effort: Pulmonary effort is normal.     Breath sounds: No stridor.  Skin:    General: Skin is warm and dry.     Findings: No rash.  Neurological:     Mental Status: She is alert and oriented to person, place, and time.      ED Treatments / Results  Labs (all labs ordered are listed, but only abnormal results are displayed) Labs Reviewed - No data to display  EKG None  Radiology  No results found. Dg Neck Soft Tissue  Result Date: 06/02/2019 CLINICAL DATA:  Swelling on the right side with difficulty swallowing, initial encounter EXAM: NECK SOFT TISSUES - 1+ VIEW COMPARISON:  None. FINDINGS: No prevertebral soft tissue abnormality is noted. The epiglottis and aryepiglottic folds appear within normal limits. The vallecula is not well aerated. No radiopaque foreign body is noted. No other focal abnormality is seen. IMPRESSION: No acute abnormality noted. CT of the neck with contrast may be helpful as clinically indicated. Electronically Signed   By: Alcide Clever M.D.   On: 06/02/2019 03:55    Procedures Procedures (including critical care time)  Medications Ordered in ED Medications - No data to display   Initial Impression / Assessment and Plan / ED Course  I have reviewed the triage vital signs and the nursing notes.  Pertinent labs & imaging results that were available during my care of the patient were reviewed by me and considered in my medical decision making (see chart for details).        Patient to ED with left sided neck pain and swelling. Symptoms started last night. She feels it may be related to an allergic reaction from medications. Last new medication started more than a month ago.   Soft tissue neck negative. Do not feel symptoms are related to abscess. No mass or midline shift of neck structures. Feel the symptoms are c/w salivary gland stone.   Discussed treatment. Return precautions also discussed.   Final Clinical Impressions(s) / ED Diagnoses   Final diagnoses:  None   1. Salivary gland stone  ED Discharge Orders    None       Elpidio Anis, PA-C 06/02/19 0539    Mesner, Barbara Cower, MD 06/02/19 [phone]

## 2019-06-02 NOTE — ED Triage Notes (Signed)
Patient is complaining of itchy and scratchy throat. She feels like her throat and the left side of her neck is swelling. Patient thinks she is having an allergic reaction to amlodipine 10 mg and Pepcid. She started taking the medication on Apr 24, 2019.

## 2019-06-02 NOTE — Discharge Instructions (Addendum)
Your symptoms are suspected to be from a stone in your saliva gland. Follow care instructions as provided. If symptoms worsen call Dr. Constance Holster to schedule an appointment.

## 2019-06-02 NOTE — ED Notes (Signed)
Pt was verbalized discharge instructions. Pt had no further questions at this time. NAD. 

## 2019-12-22 IMAGING — CT CT CHEST W/ CM
2 of 3 series · 15 of 36 positions shown, 18 images · IV contrast (omnipaque)
Comparison: Chest x-ray 03/16/2018

CLINICAL DATA: 32-year-old female with coughing, choking and
gagging after eating a can of [place] and feeling like it got
stuck in her mid chest.

EXAM:
CT CHEST WITH CONTRAST
TECHNIQUE: Multidetector CT imaging of the chest was performed during
intravenous contrast administration.
CONTRAST:  75mL OMNIPAQUE IOHEXOL 300 MG/ML  SOLN

[Series 2: axial st · axial · 0.59mm/px · z∈[+287,+519]mm · 12 of 138 slices shown, 15 images]
[im 11/138  mediastinal]
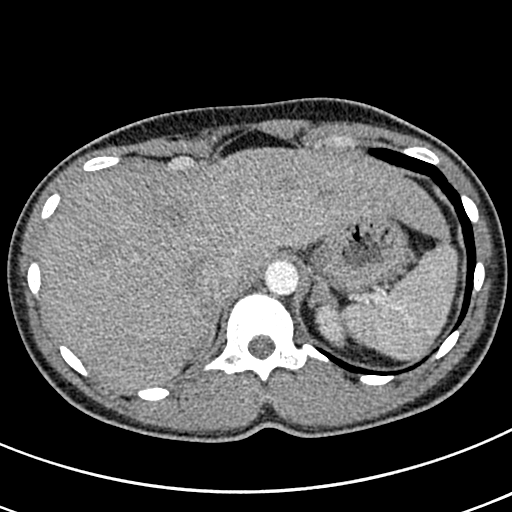
[im 11/138  lung]
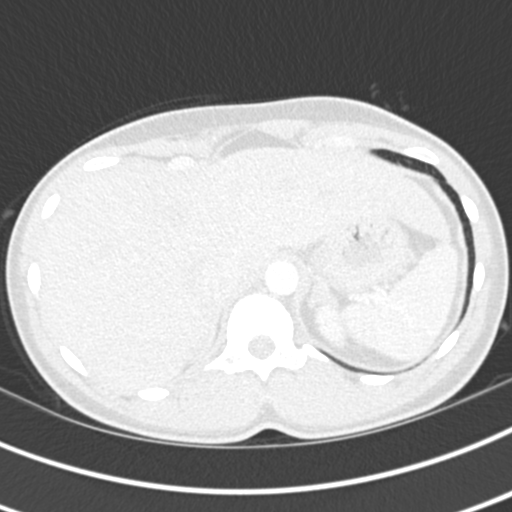
[im 21/138  lung]
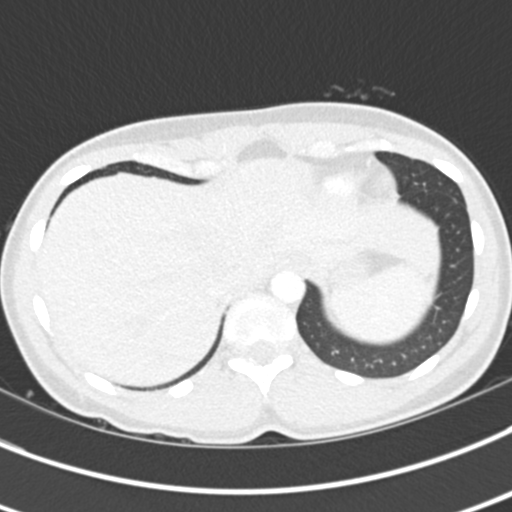
[im 31/138  lung]
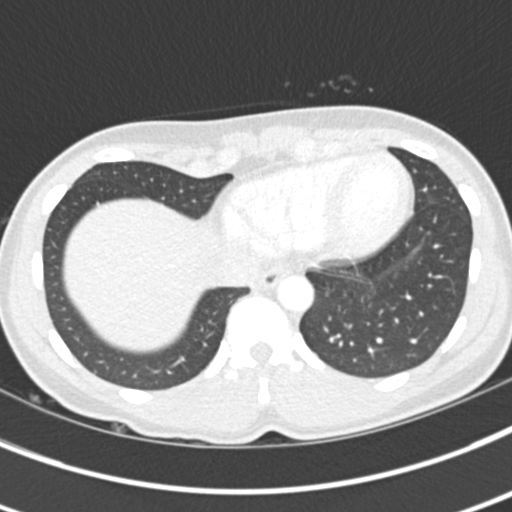
[im 41/138  lung]
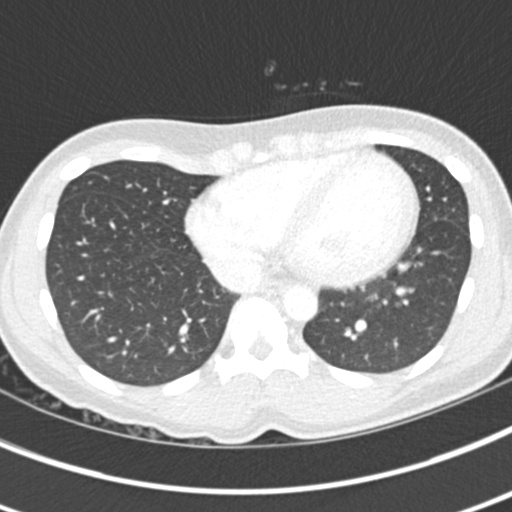
[im 51/138  mediastinal]
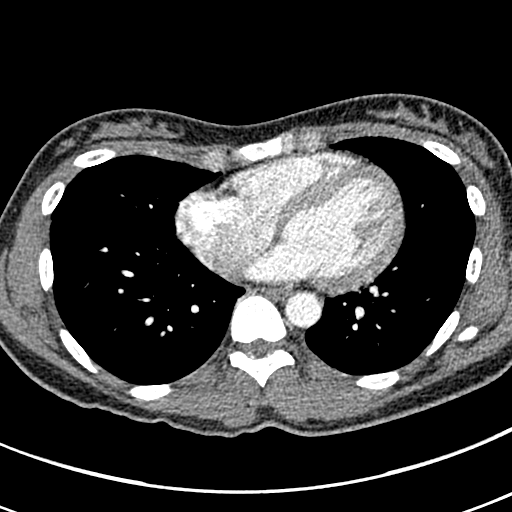
[im 51/138  lung]
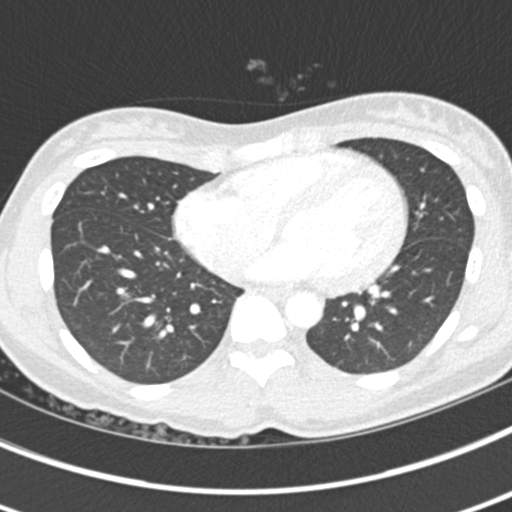
[im 61/138  lung]
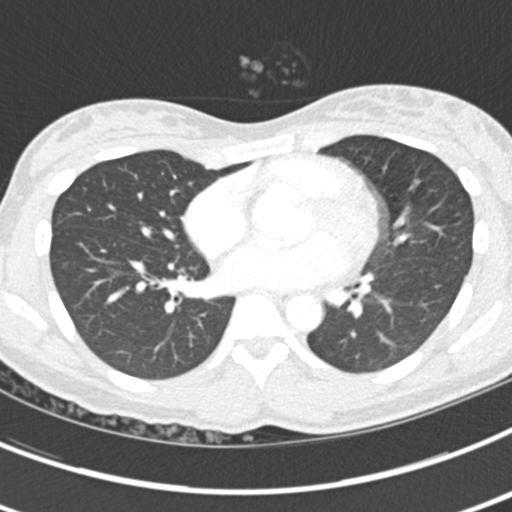
[im 77/138  lung]
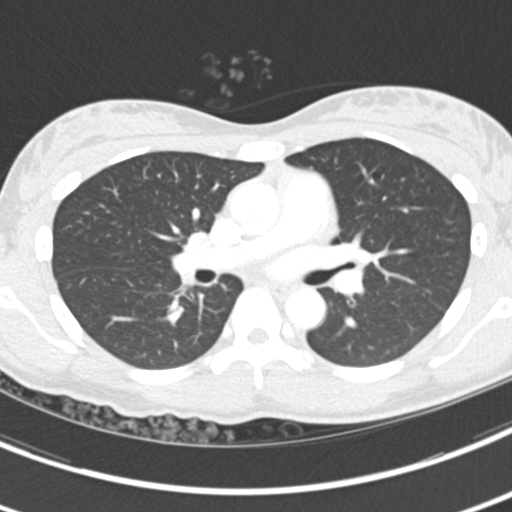
[im 87/138  lung]
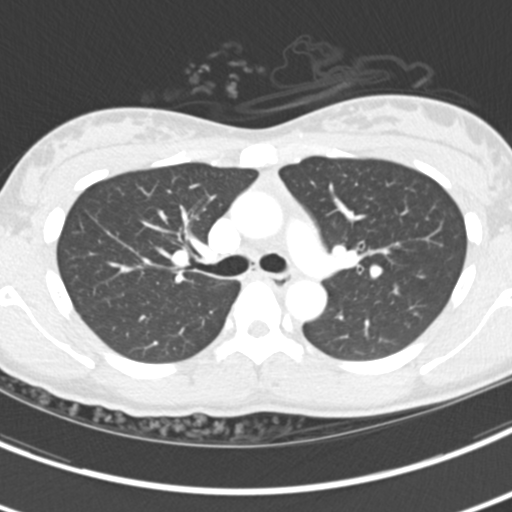
[im 97/138  mediastinal]
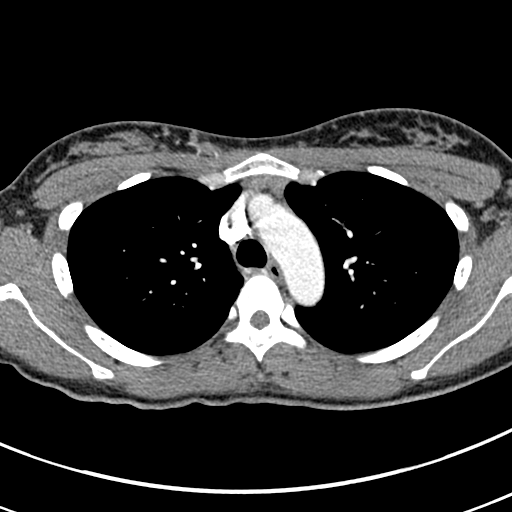
[im 97/138  lung]
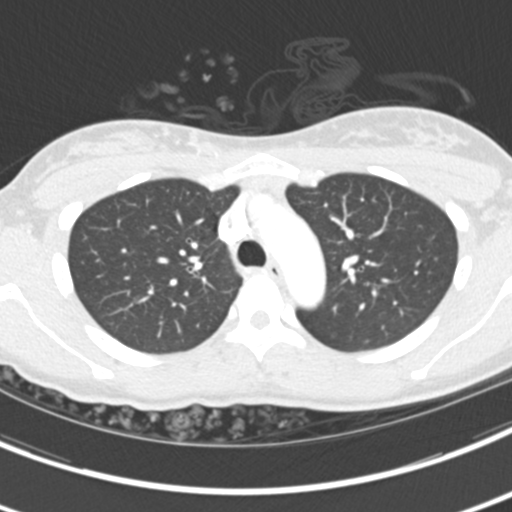
[im 107/138  lung]
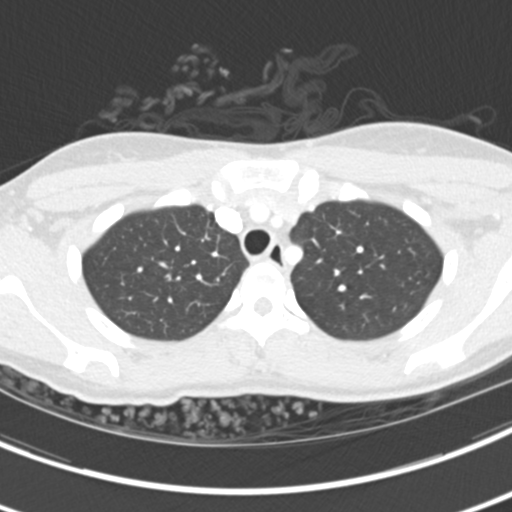
[im 117/138  lung]
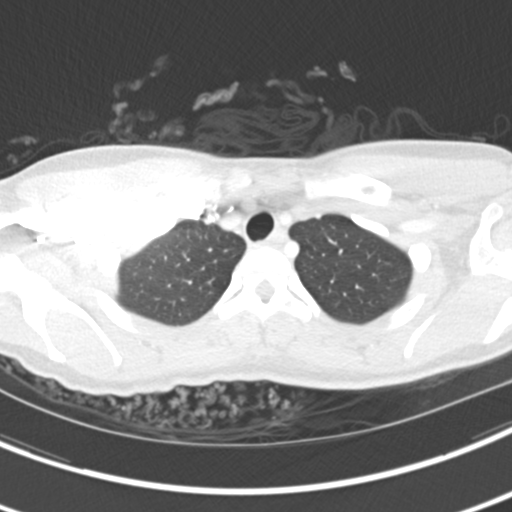
[im 127/138  lung]
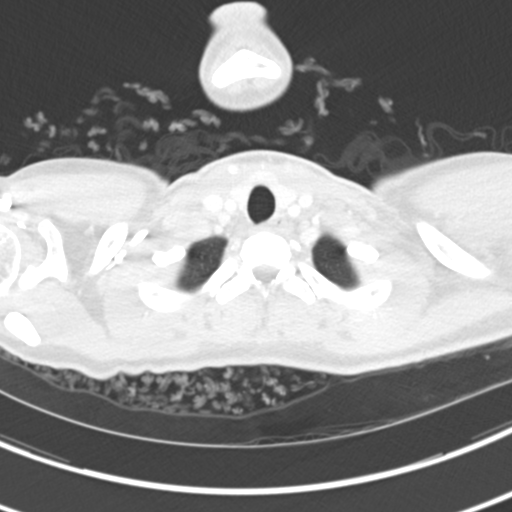

[Series 6: coronal · coronal · 0.56mm/px · 3 of 83 slices shown]
[im 17/83  lung]
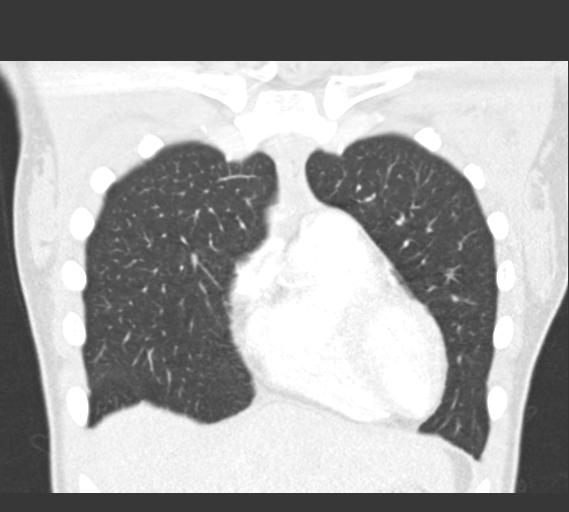
[im 33/83  lung]
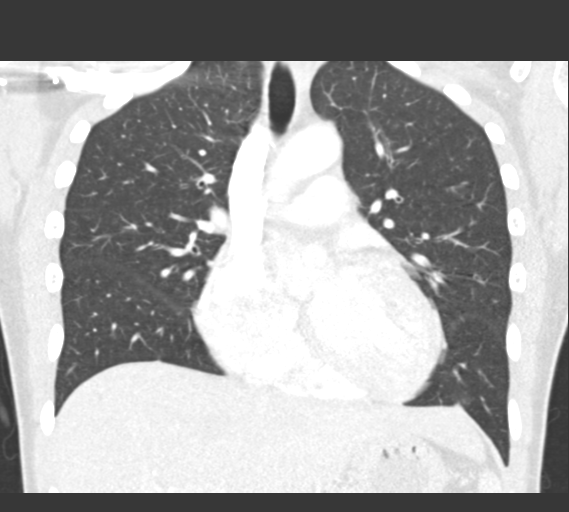
[im 50/83  lung]
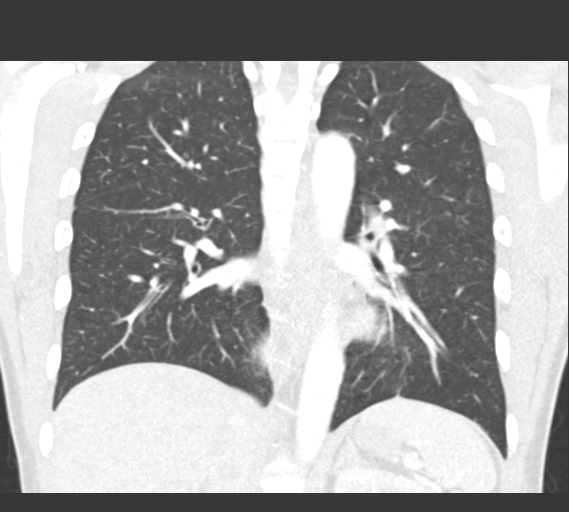

[15 of 36 positions shown; findings below may reference images not displayed]

FINDINGS: Cardiovascular: No significant vascular findings. Normal heart size.
No pericardial effusion.

Mediastinum/Nodes: No enlarged mediastinal, hilar, or axillary lymph
nodes. Thyroid gland, trachea, and esophagus demonstrate no
significant findings.

Lungs/Pleura: Lungs are clear. No pleural effusion or pneumothorax.

Upper Abdomen: No acute abnormality.

Musculoskeletal: No chest wall abnormality. No acute or significant
osseous findings.
IMPRESSION: Negative chest CT.

## 2020-07-20 NOTE — L&D Delivery Note (Signed)
OB/GYN Faculty Practice Delivery Note  Tracy Black is a 35 y.o. [id number] s/p SVD at [redacted]w[redacted]d. She was admitted for IOL due to cHTN.   ROM: 6h 73m with clear fluid GBS Status: GBS negative  Delivery Date/Time: 06/30/2021 at 0517  Delivery: Called to room and patient was complete and pushing. Head delivered ROA. No nuchal cord present. Shoulder and body delivered in usual fashion. Infant with spontaneous cry, placed on mother's abdomen, dried and stimulated. Cord clamped x 2 after 1-minute delay and cut by FOB under my direct supervision. Cord blood drawn. Brisk bleeding noted immediately after delivery. TXA and Pitocin started. Placenta delivered spontaneously with gentle cord traction. Fundus firm with massage and Pitocin. Labia, perineum, vagina, and cervix were inspected, and patient was found to have a 1st degree perineal laceration with right labial extension that was repaired with 3-0 Vicryl and 4-0 Monocryl.   Placenta: Intact, 3VC - sent to L&D Complications: None  Lacerations: 1st degree perineal with right labial extension EBL: 800 cc Analgesia: Local 1% lidocaine  Infant: Viable female  APGARs 9 and 9  3250 g  Evalina Field, MD OB/GYN Fellow, Faculty Practice

## 2020-08-08 ENCOUNTER — Telehealth: Payer: Self-pay | Admitting: General Practice

## 2020-08-08 NOTE — Telephone Encounter (Signed)
Called pt LVM for her to return our call/ pt was requesting NP appointment per CRM

## 2020-11-12 ENCOUNTER — Emergency Department (HOSPITAL_COMMUNITY)
Admission: EM | Admit: 2020-11-12 | Discharge: 2020-11-12 | Disposition: A | Discharge status: Home / Self Care | Payer: Medicaid Other | Attending: Emergency Medicine | Admitting: Emergency Medicine

## 2020-11-12 ENCOUNTER — Other Ambulatory Visit: Payer: Self-pay

## 2020-11-12 DIAGNOSIS — R0981 Nasal congestion: Secondary | ICD-10-CM | POA: Insufficient documentation

## 2020-11-12 DIAGNOSIS — I10 Essential (primary) hypertension: Secondary | ICD-10-CM | POA: Diagnosis not present

## 2020-11-12 DIAGNOSIS — J029 Acute pharyngitis, unspecified: Secondary | ICD-10-CM | POA: Diagnosis not present

## 2020-11-12 DIAGNOSIS — R21 Rash and other nonspecific skin eruption: Secondary | ICD-10-CM | POA: Diagnosis present

## 2020-11-12 DIAGNOSIS — Z79899 Other long term (current) drug therapy: Secondary | ICD-10-CM | POA: Diagnosis not present

## 2020-11-12 DIAGNOSIS — L299 Pruritus, unspecified: Secondary | ICD-10-CM | POA: Diagnosis not present

## 2020-11-12 MED ORDER — PREDNISONE 20 MG PO TABS
60.0000 mg | ORAL_TABLET | Freq: Once | ORAL | Status: AC
  Administered 2020-11-12: 60 mg via ORAL
  Filled 2020-11-12: qty 3

## 2020-11-12 MED ORDER — FAMOTIDINE 20 MG PO TABS
20.0000 mg | ORAL_TABLET | Freq: Once | ORAL | Status: AC
  Administered 2020-11-12: 20 mg via ORAL
  Filled 2020-11-12: qty 1

## 2020-11-12 MED ORDER — LIDOCAINE VISCOUS HCL 2 % MT SOLN
15.0000 mL | Freq: Once | OROMUCOSAL | Status: AC
  Administered 2020-11-12: 15 mL via OROMUCOSAL
  Filled 2020-11-12: qty 15

## 2020-11-12 MED ORDER — DIPHENHYDRAMINE HCL 25 MG PO CAPS
25.0000 mg | ORAL_CAPSULE | Freq: Once | ORAL | Status: DC
  Filled 2020-11-12: qty 1

## 2020-11-12 MED ORDER — PREDNISONE 20 MG PO TABS: 60.0000 mg | ORAL_TABLET | Freq: Every day | ORAL | 0 refills | Status: AC

## 2020-11-12 NOTE — ED Provider Notes (Signed)
[facility]-EMERGENCY DEPT Provider Note   CSN: [id number] Arrival date & time: 11/12/20  7829     History Chief Complaint  Patient presents with  . Sore Throat  . Pruritis    Tracy Black is a 35 y.o. female.  Tracy Black is a 35 y.o. female with a history of hypertension, who presents to the ED reporting feeling itchy with concern for rashes that have intermittently been occurring all over her body.  A few weeks ago she was seen for balding spot on her scalp at the dermatologist and was diagnosed with seborrheic dermatitis and prescribed ketoconazole.  She reports she thinks this area has been overall improving but is still itchy but then she is concerned it could be due to bugs or something else because she is having itching over her body and overnight woke up with congestion, sore throat and feeling like her throat was scratchy and closing up.  This started around 3 AM and she called EMS, but they could not transport her and her children so she stayed home and took 2 tablets of Benadryl, she reports she was able to go back to sleep but does not think that this helped.  She denies cough.  No fevers or chills.  She has only seen one small bug in her house, but has not seen any other bugs elsewhere.        Past Medical History:  Diagnosis Date  . Hypertension     Patient Active Problem List   Diagnosis Date Noted  . Angio-edema 04/22/2019  . Essential hypertension 04/22/2019    No past surgical history on file.   OB History    Gravida  1   Para      Term      Preterm      AB      Living        SAB      IAB      Ectopic      Multiple      Live Births              No family history on file.  Social History   Tobacco Use  . Smoking status: Never Smoker  . Smokeless tobacco: Never Used  Vaping Use  . Vaping Use: Never used  Substance Use Topics  . Alcohol use: No  . Drug use: No    Home Medications Prior to Admission  medications   Medication Sig Start Date End Date Taking? Authorizing Provider  predniSONE (DELTASONE) 20 MG tablet Take 3 tablets (60 mg total) by mouth daily for 3 days. 11/12/20 11/15/20 Yes Dartha Lodge, PA-C  amLODipine (NORVASC) 10 MG tablet Take 0.5 tablets (5 mg total) by mouth daily. 04/23/19 06/02/19  Calvert Cantor, MD  diphenhydrAMINE (BENADRYL) 50 MG tablet Take 1 tablet (50 mg total) by mouth every 6 (six) hours as needed for allergies (swelling). Patient not taking: Reported on 06/02/2019 04/23/19   Calvert Cantor, MD  famotidine (PEPCID) 20 MG tablet Take 1 tablet (20 mg total) by mouth 2 (two) times daily. 04/23/19 04/22/20  Calvert Cantor, MD  fexofenadine (ALLEGRA) 180 MG tablet Take 180 mg by mouth once.    [provider]    Allergies    Lisinopril  Review of Systems   Review of Systems  Constitutional: Negative for chills and fever.  HENT: Positive for congestion and sore throat.   Respiratory: Negative for cough and shortness of breath.  Cardiovascular: Negative for chest pain.  Gastrointestinal: Negative for abdominal pain, nausea and vomiting.  Musculoskeletal: Negative for arthralgias and myalgias.  Skin: Positive for rash.  All other systems reviewed and are negative.   Physical Exam Updated Vital Signs BP 128/87 (BP Location: Right Arm)   Pulse 91   Temp 98.8 F (37.1 C) (Oral)   Resp 20   Ht 5\' 7"  (1.702 m)   Wt 72.6 kg   LMP 10/07/2020 (Exact Date)   SpO2 98%   BMI 25.06 kg/m   Physical Exam Vitals and nursing note reviewed.  Constitutional:      General: She is not in acute distress.    Appearance: She is well-developed and normal weight. She is not ill-appearing or diaphoretic.     Comments: Well-appearing and in no distress   HENT:     Head: Normocephalic and atraumatic.     Comments: No lesions noted over the scalp    Nose: Congestion present. No rhinorrhea.     Mouth/Throat:     Mouth: Mucous membranes are moist.     Pharynx:  Oropharynx is clear.     Comments: Posterior oropharynx clear and mucous membranes moist, no erythema, edema or tonsillar exudates, uvula midline, normal phonation, no trismus, tolerating secretions without difficulty.  Eyes:     General:        Right eye: No discharge.        Left eye: No discharge.  Neck:     Comments: No stridor Cardiovascular:     Rate and Rhythm: Normal rate and regular rhythm.     Heart sounds: Normal heart sounds. No murmur heard. No friction rub. No gallop.   Pulmonary:     Effort: Pulmonary effort is normal. No respiratory distress.     Breath sounds: Normal breath sounds.     Comments: Respirations equal and unlabored, patient able to speak in full sentences, lungs clear to auscultation bilaterally Abdominal:     General: Bowel sounds are normal. There is no distension.     Palpations: There is no mass.     Tenderness: There is no abdominal tenderness. There is no guarding.  Musculoskeletal:     Cervical back: Neck supple.  Skin:    General: Skin is warm and dry.     Comments: No rashes or lesions noted over the skin, there are few faint areas that feel slightly raised over the flanks and arms but no pustules, petechiae, skin sloughing vesicular lesions.  Neurological:     Mental Status: She is alert.     Coordination: Coordination normal.  Psychiatric:        Mood and Affect: Mood normal.        Behavior: Behavior normal.     ED Results / Procedures / Treatments   Labs (all labs ordered are listed, but only abnormal results are displayed) Labs Reviewed - No data to display  EKG None  Radiology No results found.  Procedures Procedures   Medications Ordered in ED Medications  diphenhydrAMINE (BENADRYL) capsule 25 mg (25 mg Oral Patient Refused/Not Given 11/12/20 0745)  famotidine (PEPCID) tablet 20 mg (20 mg Oral Given 11/12/20 0744)  predniSONE (DELTASONE) tablet 60 mg (60 mg Oral Given 11/12/20 0743)  lidocaine (XYLOCAINE) 2 % viscous  mouth solution 15 mL (15 mLs Mouth/Throat Given 11/12/20 0744)    ED Course  I have reviewed the triage vital signs and the nursing notes.  Pertinent labs & imaging results that were available during my  care of the patient were reviewed by me and considered in my medical decision making (see chart for details).    MDM Rules/Calculators/A&P                         35 yo F presents with sore throat and itching, she has had itching intermittently for about a week and reports she has seen rashes that come and go.  No rashes noted on skin today, no vesicles, pustules or vesicular lesions.  She does not have any significant facial swelling, posterior oropharynx is clear and she is talking and able to tolerate secretions, no stridor noted.  Patient was concerned that her throat was closing but this has been ongoing since 3 AM with no significant worsening.  She is concerned she may have bugs in her house, but I do not see any bugs crawling on the patient or her children who are also here for evaluation.  I recommended she have an exterminator come out.  In the meantime we will treat for mild potential allergic reaction.  Patient took Benadryl at home, given Pepcid and prednisone and monitored here in the ED for 2 hours with no worsening in her symptoms.  We will treat with a few additional days of prednisone and have her continue using Pepcid and Benadryl at home and I have asked her to follow-up closely with her primary care provider.  Discharged home in good condition.  Patient expresses understanding and agreement with plan.  Final Clinical Impression(s) / ED Diagnoses Final diagnoses:  Pruritus  Sore throat    Rx / DC Orders ED Discharge Orders         Ordered    predniSONE (DELTASONE) 20 MG tablet  Daily        11/12/20 0806           Dartha Lodge, PA-C 11/12/20 0827    Arby Barrette, MD 11/19/20 1303

## 2020-11-12 NOTE — ED Triage Notes (Signed)
Pt presents from home, c/o sore throat and congestion. Pt also states she has felt itchy x 1 wk. Went to a dermatologist and was evaluated but states she has not improved. Denies fevers or known sick exposure.

## 2020-11-12 NOTE — Discharge Instructions (Addendum)
We will treat for potential mild allergic reaction with prednisone which you should take starting tomorrow for the next 3 days, you can also use over-the-counter Pepcid 20 mg in the morning and at night and Benadryl as needed for itching.  Could also have a viral URI contributing to symptoms are seasonal allergies.  Follow-up with your primary care provider if symptoms not improving.  Return to the ED for worsening symptoms.

## 2021-01-09 ENCOUNTER — Other Ambulatory Visit: Payer: Self-pay

## 2021-01-09 ENCOUNTER — Inpatient Hospital Stay (HOSPITAL_COMMUNITY)
Admission: AD | Admit: 2021-01-09 | Discharge: 2021-01-09 | Disposition: A | Discharge status: Home / Self Care | Payer: Medicaid Other | Attending: Obstetrics and Gynecology | Admitting: Obstetrics and Gynecology

## 2021-01-09 ENCOUNTER — Encounter (HOSPITAL_COMMUNITY): Payer: Self-pay | Admitting: Obstetrics and Gynecology

## 2021-01-09 ENCOUNTER — Inpatient Hospital Stay (HOSPITAL_COMMUNITY): Payer: Medicaid Other

## 2021-01-09 DIAGNOSIS — N9489 Other specified conditions associated with female genital organs and menstrual cycle: Secondary | ICD-10-CM

## 2021-01-09 DIAGNOSIS — O10911 Unspecified pre-existing hypertension complicating pregnancy, first trimester: Secondary | ICD-10-CM

## 2021-01-09 DIAGNOSIS — Z3A12 12 weeks gestation of pregnancy: Secondary | ICD-10-CM

## 2021-01-09 DIAGNOSIS — O26891 Other specified pregnancy related conditions, first trimester: Secondary | ICD-10-CM | POA: Insufficient documentation

## 2021-01-09 DIAGNOSIS — Z79899 Other long term (current) drug therapy: Secondary | ICD-10-CM | POA: Insufficient documentation

## 2021-01-09 DIAGNOSIS — R102 Pelvic and perineal pain: Secondary | ICD-10-CM

## 2021-01-09 DIAGNOSIS — R109 Unspecified abdominal pain: Secondary | ICD-10-CM | POA: Diagnosis not present

## 2021-01-09 DIAGNOSIS — N949 Unspecified condition associated with female genital organs and menstrual cycle: Secondary | ICD-10-CM

## 2021-01-09 LAB — COMPREHENSIVE METABOLIC PANEL
ALT: 8 U/L (ref 0–44)
AST: 13 U/L — ABNORMAL LOW (ref 15–41)
Albumin: 3.4 g/dL — ABNORMAL LOW (ref 3.5–5.0)
Alkaline Phosphatase: 47 U/L (ref 38–126)
Anion gap: 7 (ref 5–15)
BUN: 6 mg/dL (ref 6–20)
CO2: 22 mmol/L (ref 22–32)
Calcium: 9.2 mg/dL (ref 8.9–10.3)
Chloride: 103 mmol/L (ref 98–111)
Creatinine, Ser: 0.66 mg/dL (ref 0.44–1.00)
GFR, Estimated: >60 mL/min (ref >60)
Glucose, Bld: 90 mg/dL (ref 70–99)
Potassium: 3.8 mmol/L (ref 3.5–5.1)
Sodium: 132 mmol/L — ABNORMAL LOW (ref 135–145)
Total Bilirubin: 0.4 mg/dL (ref 0.3–1.2)
Total Protein: 6.3 g/dL — ABNORMAL LOW (ref 6.5–8.1)

## 2021-01-09 LAB — CBC
HCT: 34.4 % — ABNORMAL LOW (ref 36.0–46.0)
Hemoglobin: 11.7 g/dL — ABNORMAL LOW (ref 12.0–15.0)
MCH: 29.7 pg (ref 26.0–34.0)
MCHC: 34 g/dL (ref 30.0–36.0)
MCV: 87.3 fL (ref 80.0–100.0)
Platelets: 327 10*3/uL (ref 150–400)
RBC: 3.94 MIL/uL (ref 3.87–5.11)
RDW: 14.4 % (ref 11.5–15.5)
WBC: 9.3 10*3/uL (ref 4.0–10.5)
nRBC: 0 % (ref 0.0–0.2)

## 2021-01-09 LAB — WET PREP, GENITAL
Sperm: NONE SEEN
Trich, Wet Prep: NONE SEEN
Yeast Wet Prep HPF POC: NONE SEEN

## 2021-01-09 LAB — ABO/RH: ABO/RH(D): O POS

## 2021-01-09 LAB — URINALYSIS, ROUTINE W REFLEX MICROSCOPIC
Bilirubin Urine: NEGATIVE
Glucose, UA: NEGATIVE mg/dL
Hgb urine dipstick: NEGATIVE
Ketones, ur: NEGATIVE mg/dL
Leukocytes,Ua: NEGATIVE
Nitrite: NEGATIVE
Protein, ur: NEGATIVE mg/dL
Specific Gravity, Urine: 1.004 — ABNORMAL LOW (ref 1.005–1.030)
pH: 8 (ref 5.0–8.0)

## 2021-01-09 LAB — POCT PREGNANCY, URINE: Preg Test, Ur: POSITIVE — AB

## 2021-01-09 LAB — HCG, QUANTITATIVE, PREGNANCY: hCG, Beta Chain, Quant, S: 172005 m[IU]/mL — ABNORMAL HIGH (ref <5)

## 2021-01-09 LAB — OB RESULTS CONSOLE GC/CHLAMYDIA: Gonorrhea: NEGATIVE

## 2021-01-09 NOTE — MAU Note (Signed)
Pt reports she has been having a sharp pain from the right side of her abdomen to her vagina.   Pt reports + pregnancy test around June 10 -12  Pt reports not being seen for this pregnancy yet.

## 2021-01-09 NOTE — MAU Provider Note (Addendum)
History     CSN: [id number]  Arrival date and time: 01/09/21 2005   Event Date/Time   First Provider Initiated Contact with Patient 01/09/21 2056      Chief Complaint  Patient presents with   Abdominal Pain   Ms. Tracy Black is a 35 y.o. [phone] at [redacted]w[redacted]d who presents to MAU for right sided abdominal pain radiating down in to her vagina. Patient reports randomly, but more with movement, she experiences a sharp, shooting, right-sided pain that resolves quickly. Patient reports sometimes she experiences a dull ache over that same area. Patient denies bleeding or vaginal discharge. Patient reports the pain is not present at this time.  Pt denies VB, LOF, ctx, vaginal discharge/odor/itching. Pt denies N/V, constipation, diarrhea, or urinary problems. Pt denies fever, chills, fatigue, sweating or changes in appetite. Pt denies SOB or chest pain. Pt denies dizziness, HA, light-headedness, weakness.   OB History     Gravida  4   Para  2   Term  2   Preterm      AB  1   Living  2      SAB  1   IAB      Ectopic      Multiple      Live Births  2           Past Medical History:  Diagnosis Date   Hypertension     History reviewed. No pertinent surgical history.  History reviewed. No pertinent family history.  Social History   Tobacco Use   Smoking status: Never   Smokeless tobacco: Never  Vaping Use   Vaping Use: Never used  Substance Use Topics   Alcohol use: No   Drug use: No    Allergies:  Allergies  Allergen Reactions   Lisinopril Swelling    Lip swelling Lip swelling    Medications Prior to Admission  Medication Sig Dispense Refill Last Dose   amLODipine (NORVASC) 10 MG tablet Take 0.5 tablets (5 mg total) by mouth daily. 30 tablet 1    diphenhydrAMINE (BENADRYL) 50 MG tablet Take 1 tablet (50 mg total) by mouth every 6 (six) hours as needed for allergies (swelling). (Patient not taking: Reported on 06/02/2019) 30 tablet 0    famotidine  (PEPCID) 20 MG tablet Take 1 tablet (20 mg total) by mouth 2 (two) times daily. 60 tablet 1    fexofenadine (ALLEGRA) 180 MG tablet Take 180 mg by mouth once.       Review of Systems  Constitutional:  Negative for chills, diaphoresis, fatigue and fever.  Eyes:  Negative for visual disturbance.  Respiratory:  Negative for shortness of breath.   Cardiovascular:  Negative for chest pain.  Gastrointestinal:  Positive for abdominal pain. Negative for constipation, diarrhea, nausea and vomiting.  Genitourinary:  Negative for dysuria, flank pain, frequency, pelvic pain, urgency, vaginal bleeding and vaginal discharge.  Neurological:  Negative for dizziness, weakness, light-headedness and headaches.   Physical Exam   Blood pressure 137/88, pulse 87, temperature 98.5 F (36.9 C), temperature source Oral, resp. rate 12, weight 74.6 kg, last menstrual period 10/13/2020, SpO2 99 %, unknown if currently breastfeeding.  Patient Vitals for the past 24 hrs:  BP Temp Temp src Pulse Resp SpO2 Weight  01/09/21 2037 137/88 98.5 F (36.9 C) Oral 87 12 99 % --  01/09/21 2030 -- -- -- -- -- -- 74.6 kg   Physical Exam Constitutional:      General: She is not in acute  distress.    Appearance: She is well-developed. She is not diaphoretic.  HENT:     Head: Normocephalic and atraumatic.  Pulmonary:     Effort: Pulmonary effort is normal.  Abdominal:     General: There is no distension.     Palpations: Abdomen is soft. There is no mass.     Tenderness: There is no abdominal tenderness. There is no guarding or rebound.  Skin:    General: Skin is warm and dry.  Neurological:     Mental Status: She is alert and oriented to person, place, and time.  Psychiatric:        Behavior: Behavior normal.        Thought Content: Thought content normal.        Judgment: Judgment normal.   Results for orders placed or performed during the hospital encounter of 01/09/21 (from the past 24 hour(s))  Pregnancy, urine  POC     Status: Abnormal   Collection Time: 01/09/21  8:39 PM  Result Value Ref Range   Preg Test, Ur POSITIVE (A) NEGATIVE   No results found.  MAU Course  Procedures  MDM -suspect RLP, r/o ectopic as no [place] on file -ectopic work-up entered -results pending, report given to Dr. Ron Parker, Odie Sera, Tracy Black  9:39 PM 01/09/2021 -all lab work unremarkable, confirmed IUP, hcg appropriate  Assessment and Plan   34yo G4P2012 at [redacted]w[redacted]d by u/s today in MAU.  #Round ligament pain Patient with abdominal pain as noted above with no associated symptoms. Confirmed IUP with appropriate hcg and unremarkable lab work. VSS. Patient encouraged to utilize conservative management with tylenol, heating pad, stretching etc. Given that she has no vaginal discharge or concerning symptoms will not treat for BV despite clue cells.  #cHTN Patient with known cHTN, previously on norvasc, not taking. Discussed establishing with OBGYN and starting BP meds. preE labs unremarkable. No severe range bp.  Alric Seton, MD OB Fellow, Faculty [facility], Center for [facility] 01/09/2021 11:07 PM

## 2021-01-09 NOTE — Discharge Instructions (Signed)
AREA FAMILY PRACTICE PHYSICIANS  [place] ([address]) [facility] [address] [place], [place] [address] [phone] Mon-Fri 8:30-12:30, 1:30-5:00 Accepting Medicaid [facility] [address], [place], [place] [address] [phone] Mon-Fri 8:00-5:30 [facility] [address] [place], [place] [address] [phone] Mon, Tue, Thur, Fri 8:30-5:00, Wed 10:00-7:00 (closed 1-2pm) Accepting Medicaid [facility] [address], [place], [place]  [address] Phone - [phone]   Fax - [phone]  [place] ([address]) [facility] [address] [place], [place] [address] [phone] Mon-Fri 8:00-5:00 [facility] ([facility])  [address] [place], [place] [address] [phone] Mon-Fri 8:30-5:30, Sat (Oct.-Mar.) 9:00-1:00 Accepting Medicaid  [place] ([address]) [facility] [address], [place], [place] [address] [phone] Mon-Fri 8:00-5:00  [place] ([address]) [facility] [address], [place], [place] [address] [phone] Mon-Fri 8:00-5:00 [facility] [address], [place], [place] [address] [phone] Mon-Fri 8:00-5:00 [facility] [place] [address] [place], [place] [address] [phone] Mon-Fri 8:00-5:00 [facility] [address] [place] [address] [phone] Mon-Fri 7:30-5:30  [place] ([address] & [address]) [facility] [address] [place], [place] [address] [phone] Mon-Thur 8:00-6:00 Accepting Medicaid [facility] [address] [place], [place] [address] [phone] Mon-Thur 7:30-7:30, Fri 7:30-4:30 Accepting  Medicaid [facility] [address], [place], [place]  [address] [phone]   Fax - [phone]  [place] ([address] & [address]) [facility] [address] [place], [place] [address] [phone] Mon-Fri 7:00-5:00 [facility] [address], [place], [place] [address] [phone] Mon-Fri 8:00-5:00 Accepting Medicaid [facility] [address], [place], [place] [address] [phone] Mon-Fri 8:00-5:00 Accepting Medicaid  [place] ([address]) [facility] [place] [address] [place], [place] [address] [phone] Mon-Fri 8:00-5:00 [facility] [address], [place], [place] [address] [phone] Mon-Fri 8:00-5:00 Accepting Medicaid [facility] ([facility]) [address], [place], [place] [address] [phone] Mon-Fri 8:00-5:30, Sat&Sun by appointment (phones open at 8:30) Accepting Medicaid  [place] ([address]) [facility] [address] [place], [place] [address] [phone] Mon-Thur 8:00-7:00, Fri 8:00-5:00, Sat 8:00-12:00, Sun 9:00-12:00 Accepting Medicaid [facility] [place] [address], [place], [place] [address] [phone] Mon-Thur 8:00-5:00 Accepting Medicaid [facility] [place] [address] [place], [place] [address] [phone] Mon-Fri 8:00-5:30, Sat (Oct.-Mar.) 9:00-1:00 Accepting Medicaid  [place] ([address]) [facility] [address], [place], [place] [address] [phone] Mon-Fri 8:00-5:00 Accepting Medicaid   [place] ([address]) [facility] [place] [address], [place], [place] [address] [phone] Mon-Fri 8:00-5:00  [facility] [place] [address], [place], [place] [address] [phone] Mon-Fri 8:00-5:00 [facility] [place] [address] [place] [address], [place], [place] [address] [phone] Mon-Fri 8:00-5:00 After hours clinic ([address] [place], [place] [address]) [phone] Mon-Fri 5:00-8:00, Sat 12:00-6:00, Sun 10:00-4:00 Accepting Medicaid [facility] [place]   [address] [place], [place]  [address] [phone]   Fax - [phone]  [place] [phone]) Adult nurse [facility] at [facility] [address] [place], [place], [place] [address] [phone] Mon-Fri 8:00-5:00 [facility] [place] [facility] [place]) [address] [place] [address], [place], [place] [address] [phone] Mon-Thur 8:00-7:00, Fri 8:00-5:00, Sat 8:00-12:00         Prenatal Care Providers           Center for [facility] @ [facility] - accepts patients without insurance  Phone: [phone]  Center for [facility] @ [facility]   Phone: [phone]  [facility]       Phone: [phone]            Center for [facility] Healthcare @ [place]     Phone: [phone]          Center for [facility] Healthcare @ [facility]   Phone: [phone]  Center for [facility] @ [facility] - accepts patients without insurance  Phone: [phone]  Center for [facility] @ [facility] Phone: [phone]     [facility] - accepts patients without insurance Phone: [phone]  [facility]  Phone: [phone]  [phone] OB/GYN Phone: [phone]  [facility] Phone: [phone]  [facility] Phone: [phone]  [facility] Phone: [phone]  [facility]  Phone:  [phone]

## 2021-01-10 LAB — GC/CHLAMYDIA PROBE AMP (~~LOC~~) NOT AT ARMC
Chlamydia: NEGATIVE
Comment: NEGATIVE
Comment: NORMAL
Neisseria Gonorrhea: NEGATIVE

## 2021-01-21 ENCOUNTER — Telehealth (INDEPENDENT_AMBULATORY_CARE_PROVIDER_SITE_OTHER): Payer: Medicaid Other

## 2021-01-21 DIAGNOSIS — Z3A00 Weeks of gestation of pregnancy not specified: Secondary | ICD-10-CM

## 2021-01-21 DIAGNOSIS — O169 Unspecified maternal hypertension, unspecified trimester: Secondary | ICD-10-CM

## 2021-01-21 DIAGNOSIS — O0990 Supervision of high risk pregnancy, unspecified, unspecified trimester: Secondary | ICD-10-CM | POA: Insufficient documentation

## 2021-01-21 NOTE — Patient Instructions (Signed)
At our [facility], we work as an integrated team, providing care to address both physical and emotional health. Your medical provider may refer you to see our Behavioral Health Clinician [facility]) on the same day you see your medical provider, as availability permits; often scheduled virtually at your convenience.  Our [facility] is available to all patients, visits generally last between 20-30 minutes, but can be longer or shorter, depending on patient need. The [facility] offers help with stress management, coping with symptoms of depression and anxiety, major life changes , sleep issues, changing risky behavior, grief and loss, life stress, working on personal life goals, and  behavioral health issues, as these all affect your overall health and wellness.  The [facility] is NOT available for the following: FMLA paperwork, court-ordered evaluations, specialty assessments (custody or disability), letters to employers, or obtaining certification for an emotional support animal. The [facility] does not provide long-term therapy. You have the right to refuse integrated behavioral health services, or to reschedule to see the [facility] at a later date.  Confidentiality exception: If it is suspected that a child or disabled adult is being abused or neglected, we are required by law to report that to either Child Protective Services or Adult Management consultantrotective Services.  If you have a diagnosis of Bipolar affective disorder, Schizophrenia, or recurrent Major depressive disorder, we will recommend that you establish care with a psychiatrist, as these are lifelong, chronic conditions, and we want your overall emotional health and medications to be more closely monitored. If you anticipate needing extended maternity leave due to mental health issues postpartum, it it recommended you inform your medical provider, so we can put in a referral to a psychiatrist as soon as possible. The [facility] is unable to recommend an extended maternity leave for mental  health issues. Your medical provider or [facility] may refer you to a therapist for ongoing, traditional therapy, or to a psychiatrist, for medication management, if it would benefit your overall health. Depending on your insurance, you may have a copay or be charged a deductible, depending on your insurance, to see the [facility]. If you are uninsured, it is recommended that you apply for financial assistance. (Forms may be requested at the front desk for in-person visits, via MyChart, or request a form during a virtual visit).  If you see the [facility] more than 6 times, you will have to complete a comprehensive clinical assessment interview with the [facility] to resume integrated services.  For virtual visits with the [facility], you must be physically in the state of [place] at the time of the visit. For example, if you live in [place], you will have to do an in-person visit with the [facility], and your out-of-state insurance may not cover behavioral health services in [place]. If you are going out of the state or country for any reason, the [facility] may see you virtually when you return to [place], but not while you are physically outside of [place].   AREA PEDIATRIC/FAMILY PRACTICE PHYSICIANS  Central/Southeast [place] ([phone]) [facility] Deirdre Priesthambliss, MD; Lum BabeEniola, MD; Sheffield SliderHale, MD; Leveda AnnaHensel, MD; McDiarmid, MD; Jerene BearsMcIntyer, MD; Jennette KettleNeal, MD; Gwendolyn GrantWalden, MD [address] [place], [place] [address] [phone] Mon-Fri 8:30-12:30, 1:30-5:00 Providers come to see babies at [facility] Accepting Viewpoint Assessment CenterMedicaid [facility] providers who accept newborns: Docia ChuckKoirala, MD; Kateri PlummerMorrow, MD; Paulino RilyWolters, MD [address], [place], [place] [address] [phone] Mon-Fri 8:00-5:30 Babies seen by providers at [facility] Does NOT  accept Medicaid Please call early in hospitalization for appointment (limited availability)  [facility] Homeland, MD [address] [place], [place] [address] [phone] Farris Has, Thur, Fri 8:30-5:00, Wed 10:00-7:00 (closed 1-2pm) Babies seen by [facility] providers Accepting Medicaid Donnie Coffin - Pediatrician Donnie Coffin, MD [address], [place], [place] [address] [phone] Mon-Fri 8:30-5:00, Sat 8:30-12:00 Provider comes to see babies at [facility] Accepting Medicaid Must have been referred from current patients or contacted office prior to delivery [facility]) Manson Passey, MD; Ave Filter, MD; Luna Fuse, MD; Kennedy Bucker, MD; Konrad Dolores, MD; Kathlene November, MD; Jenne Campus, MD; Lubertha South, MD; Wynetta Emery, MD; Duffy Rhody, MD; Gerre Couch, NP; Shirl Harris, NP [address] [place], [place], [place] [address] [phone] Mon, Halford Decamp, Thur, Fri 8:30-5:30, Wed 9:30-5:30, Sat 8:30-12:30 Babies seen by [facility] providers Accepting Medicaid Only accepting infants of first-time parents or siblings of current patients Hospital discharge coordinator will make follow-up appointment Cyril Mourning [address], [place], [place]  [address] [phone]   Fax - [phone] [facility] [address], [place], [place]  [address] Phone - [phone]   Fax - [phone] Lucio Edward [address] [place], [place]  [address] [phone]  East/[place]ortheast [place] [phone]) [facility] Jenne Pane, MD; Alita Chyle, MD; Princella Ion, MD; MD; Earlene Plater, MD; Jamesetta Orleans, MD; Alvera Novel, MD; Clarene Duke, MD; Rana Snare, MD; Carmon Ginsberg, MD; Alinda Money, MD; Hosie Poisson, MD; Mayford Knife, MD [address] [place], [place] [address] [phone] Mon-Fri 8:30-5:00 (extended evenings Mon-Thur as needed), Sat-Sun 10:00-1:00 Providers come to see babies at [facility] Accepting Medicaid for families of first-time babies and families with all children in the household age 41 and under. Must register with office prior to making appointment (M-F only). [facility] Suezanne Jacquet, NP; Lynelle Doctor, MD; Susann Givens, MD; [place], [place] [address], [place], [place] [address] [phone] Mon-Fri 8:00-5:00 Babies seen by providers at [facility] Does NOT accept Medicaid/Commercial Insurance Only [facility] at [place] [facility])  Holly Bodily, MD; Zachery Dauer, MD; Stefan Church, MD; Sabino Dick, MD; Quitman Livings, MD; Farris Has, MD; Gaynell Face, MD; Betha Loa, MD; Colon Flattery, MD; Clifton James, MD [address] [place], [place] [address] [phone] Mon-Fri 8:30-5:30, Sat (Oct.-Mar.) 9:00-1:00 Babies seen by providers at [facility] Accepting [facility]  [place] [phone]) [facility] Nestle, MD; Sheliah Hatch, MD [address], [place], [place] [address] [phone] Mon-Fri 8:30-5:00, Sat 8:30-12:00 Providers come to see babies at [facility] Does NOT accept [facility] at Lutricia [place], [place]; Tracie Harrier, MD; [place], [place]; Wynelle Link, MD; Azucena Cecil, MD [address] [place], [place] [address] [phone] Mon-Fri 8:00-5:00 Babies seen by providers at [facility] Does NOT accept Medicaid Only accepting babies of parents who are patients Please call early in hospitalization for appointment (limited availability) [facility] Pediatricians Chestine Spore, MD; Abran Cantor, MD; Early Osmond, MD; Cherre Huger, NP; Hyacinth Meeker, MD; Dwan Bolt, MD; Jarold Motto, NP; Dario Guardian, MD; Talmage Nap, MD; Maisie Fus, MD; Pricilla Holm, MD; Tama High, MD [address], [place], [place] [address] [phone] Mon-Fri 8:00-5:00, Sat 9:00-12:00 Providers come to see babies at [facility] Does NOT accept [facility] [phone]) [facility] Spr[facility] Family [facility] at [facility] providers accepting new patients: Drema Pry, NP; Delena Serve, PA [address] [place], [place] [address] [phone] Mon-Fri 8:00-5:00 Babies seen by providers at [facility] Does NOT accept Medicaid Only accepting babies of parents who are  patients Please call early in hospitalization for appointment (limited availability) [facility] Cardell Peach, MD;  Nash Dimmer, MD [address] [place] [place], [place] [address] [phone] (press 1 to schedule appointment) Mon-Fri 8:00-5:00 Providers come to see babies at [facility] Does NOT accept [facility], MD [address] [place], [place] [address] [phone] Mon-Fri 8:30-5:00 (lunch 12:30-1:00), extended hours by appointment only Wed 5:00-6:30 Babies seen by [facility] providers Accepting Medicaid [facility] Carmine, MD; [place], MD; Hassan Rowan, MD [address] [place], [place], [place] [address] [phone] Mon-Fri 8:00-5:00 Babies seen by [facility] providers Does NOT accept Medicaid [facility] Boykin Peek, MD; Durene Cal, MD; [place], [place] [address] [place], [place] [address] [phone] Mon-Fri 8:00-5:00 Babies seen by [facility] providers Does NOT accept [facility] [place], [place]; [place], [place]; [place], NP; Avis Epley, MD; Vonna Kotyk, MD; Clance Boll, MD; Stevphen Rochester, NP; Arvilla Market, NP; Ann Maki, NP; Otis Dials, NP; Vaughan Basta, MD; Eartha Inch, MD [address] [place], [place] [address] [phone] Mon-Fri 8:30-5:00, Sat 10:00-1:00 Providers come to see babies at [facility] Does NOT accept Medicaid Free prenatal information session Tuesdays at 4:45pm [facility] Cambalache, MD; [place], [place]; [place], [place]; [place], [place] [address] [place] [address] [phone] Mon-Fri 7:30-5:30 Babies seen by [facility] providers [facility] Doctor [address], [place], [place]  [address] [phone]   Fax - [phone]  [place] [phone] & [phone]) [facility], MD [address], [place], [place] [address] [phone] Mon-Thur 8:00-6:00 Providers come to see babies at [facility] Accepting  Medicaid [facility] Dareen Piano, NP; Cyndia Bent, MD; [place], [place]; [place], [place] [address] [place], [place] [address] [phone] Mon-Thur 7:30-7:30, Fri 7:30-4:30 Babies seen by [facility] providers Accepting [facility] Juanito Doom, MD; Janene Harvey, NP; Vonita Moss, MD [address], [place], [place] [address] [phone] Mon-Fri 8:30-5:00, Sat 8:30-12:00 Providers come to see babies at [facility] Accepting Medicaid Must have "Meet & Greet" appointment at office prior to delivery [facility]) Marlow Baars, MD; Earlene Plater, MD; Lucretia Roers, MD [address], [place], [place] [address] [phone] Mon-Wed 8:00-6:00, Thur-Fri 8:00-5:00, Sat 9:00-12:00 Providers come to see babies at [facility] Does NOT accept Medicaid Only accepting siblings of current patients [facility]  [address], [place], [place]  [address] [phone]   Fax - [phone] [facility] Medicine at [facility] [address] [place], [place]  [address] [phone]   Fax - [phone]  [place] [phone] & [phone]) Adult nurse HealthCare at [facility], [place]; [place], [place] [address] [place], [place] [address] [phone] Mon-Fri 7:00-5:00 Babies seen by [facility] providers Does NOT accept Medicaid [facility] [place], MD; [place], [place]; [place], [place] [address], [place], [place] [address] [phone] Mon-Fri 8:00-5:00 Babies seen by [facility] providers Accepting [facility] - [facility]ann Lodge Harleigh, MD; [place], [place]; Lelia [place], NP; [place], [place] [address] [place], [place], [place] [address] [phone] Mon-Fri 8:00-5:00 Babies seen by providers at [facility] Accepting [facility] [place]  [phone]) [facility] at [facility] [place], [place] [address] [place], [place] [address] [phone] Mon-Fri 8:00-5:00 Babies seen by [facility] providers Does NOT accept Medicaid Limited availability, please call early in hospitalization to schedule follow-up [facility] Jeanelle Malling, [place]; Eddie Candle, MD; Normand Sloop, MD; [place], [place]; Constance Goltz, MD; [place], [place] [address] [facility] [address], [place], [place] [address] [phone] Mon-Fri 8:30-5:00, Sat  9:00-12:00 Babies seen by providers at [facility] Accepting Medicaid Please register online then schedule online or call office [email or web address] [facility] at [facility]) Durene Cal, NP; Lucianne Muss, MD; [place], [place] [address], [place], [place] [address] [phone] Mon-Fri 8:00-5:00 Babies seen by providers at [facility] Accepting [facility] - [facility] ([facility] [place]) [place], MD; Reed Breech, NP; Shelva Majestic, MD [address], [place], [place] [address] [phone] Mon-Fri 8:00-5:30, Sat&Sun by appointment (phones open at 8:30) Babies seen by [facility] providers Accepting Medicaid Must be a first-time baby or sibling of current patient [facility] - [place]  [address], [place], [place]  [address] [phone]   Fax - [phone]  [place] [address] [phone] & [phone]) [facility] Bonnie Brae, [place]; [place]ira [place], [place]; [place], MD; [place], [place]; Carolyne Fiscal, MD [address] [place], [place] [address] [phone] Mon-Thur 8:00-7:00, Fri 8:00-5:00, Sat 8:00-12:00, Sun 9:00-12:00 Babies seen by [facility] providers Accepting Medicaid [facility] Family Medicine at Liana Gerold, MD; Gaynell Face, MD; [facility], MD [address], [place], [place] [address] [phone] Mon-Thur 8:00-5:00 Babies seen by providers at [facility] Accepting Medicaid [facility] - Family Medicine at Dorthey Sawyer, MD; Coe[place]Go[place], MD; Madilyn Fireman, MD; Melvyn Neth, MD; List, MD; Lazarus Salines, MD; Gaynell Face, MD; Berneda Rose, MD; Flora Lipps, MD; Beryl Meager, MD; Luther Redo, MD; Lavonia Drafts, MD; Kellie Simmering, MD [address] [place], [place] [address] [phone] Mon-Fri 8:00-5:30, Sat (Oct.-Mar.) 9:00-1:00 Babies seen by providers at [facility] Accepting Medicaid Must fill out new patient packet, available online at [email or web address] [facility] - [place] [facility] Pediatrics at [facility]) Spero Geralds, NP; Tiburcio Pea, NP; Tresa Endo, NP; Whitney Post, MD; [place], [place]; Hennie Duos, MD; [place], MD; Kavin Leech, NP [address], [place], [place] [address] [phone] Mon-Thur 8:00-5:30, Fri 8:00-5:00 Babies seen by providers at [facility] Accepting [facility]  [place] [phone]) [facility] [place], [place]; Bovina, MD; [place], MD; [place], [place] [address] [place], [place] [address] [phone] Mon-Fri 8:00-5:00 Babies seen by providers at [facility] Accepting [facility]   [place] [phone]) [facility] at [facility], [place]; Lenise Arena, MD; [place], [place] [address], [place], [place] [phone] Mon-Fri 8:00-5:00 Babies seen by providers at [facility] Does NOT accept Medicaid Limited appointment availability, please call early in hospitalization  [place] [facility], [place]; [place], MD [address], [place], [place] [address] [phone] Mon-Fri 8:00-5:00 Babies seen by [facility] providers Does NOT accept [facility] - [facility], MD; Ninetta Lights, MD; [place], [place]; Ider, MD [address], [place], [place] [address] [phone] Mon-Fri 8:00-5:00 After hours clinic [facility] [place], [place] [address]) [phone] Mon-Fri 5:00-8:00, Sat 12:00-6:00, Sun 10:00-4:00 Babies  seen by [facility] providers Accepting [facility] at [facility] [address] [place], [place]  [address] [phone]   Fax - [phone]  [place] [phone]) Adult nurse HealthCare at [facility], MD [address] [place], [place], [place] [address] [phone] Mon-Fri 8:00-5:00 Babies seen by [facility] providers Does NOT accept Medicaid [facility] - [place] [facility] at [place]) Rene Kocher, MD [address] [place] [address] [place], [place] [address] [phone] Mon-Thur 8:00-7:00, Fri 8:00-5:00, Sat 8:00-12:00 Babies seen by providers at [facility] Accepting Medicaid - but does not have vaccinations in  office (must be received elsewhere) Limited availability, please call early in hospitalization  [place] [phone]) [facility]  Wyvonne Lenz, MD [address], [place] [address] [phone]  Fax [phone]

## 2021-01-21 NOTE — Progress Notes (Signed)
Pt advised that she was taking Amlodipine for Hypertension but feels that it lowered her BP down too much, so was nervous to continue taking it. Pt also noted that she spends weekends in [place] & some times stays during the week to complete sentence. The [address] will not let her take Prenatal Vitamins without a Dr's note.

## 2021-01-21 NOTE — Progress Notes (Signed)
New OB Intake  I connected with  Tracy Black on 01/21/21 at  2:15 PM EDT by MyChart Video Visit and verified that I am speaking with the correct person using two identifiers. [facility] and pt is located at home.  I discussed the limitations, risks, security and privacy concerns of performing an evaluation and management service by telephone and the availability of in person appointments. I also discussed with the patient that there may be a patient responsible charge related to this service. The patient expressed understanding and agreed to proceed.  I explained I am completing New OB Intake today. We discussed her EDD of 07/20/21 that is based on LMP of 10/13/20. Pt is G4/P2. I reviewed her allergies, medications, Medical/Surgical/OB history, and appropriate screenings. I informed her of [facility] services. Based on history, this is a/an  pregnancy complicated by hypertension .   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   MyChart/Babyscripts MyChart access verified. I explained pt will have some visits in office and some virtually. Babyscripts instructions given and order placed. Patient verifies receipt of registration text/e-mail. Account successfully created and app downloaded.  Blood Pressure Cuff  Blood pressure cuff ordered for patient to pick-up from [employer]. Explained after first prenatal appt pt will check weekly and document in Babyscripts.  Weight scale: Patient    have weight scale. Weight scale ordered   Anatomy US Explained first scheduled [place] will be around 19 weeks. Anatomy US scheduled for 02/26/21 at 9:45 a. Pt notified to arrive at 09:30 a.  Labs Discussed Avelina Laine genetic screening with patient. Would like both Panorama and Horizon drawn at new OB visit. Routine prenatal labs needed.  Covid  Vaccine Patient has not covid vaccine.   Mother/ Baby Dyad Candidate?    If yes, offer as possibility  Inform patient of [facility] and place . In AVS   Social Determinants of Health Food Insecurity: Patient denies food insecurity. WIC Referral: Patient is interested in [facility] to [facility].  Transportation: Patient denies transportation needs. Childcare: Discussed no children allowed at ultrasound appointments. Offered childcare services; patient expresses need for childcare services. Childcare scheduled for appropriate appointments and information given to patient.  First visit review I reviewed new OB appt with pt. I explained she will have a pelvic exam, ob bloodwork with genetic screening, and PAP smear. Explained pt will be seen by Dr. Alysia Penna at first visit; encounter routed to appropriate provider. Explained that patient will be seen by pregnancy navigator following visit with provider. [facility] information placed in AVS.   Henrietta Dine, CMA 01/21/2021  2:14 PM

## 2021-01-29 ENCOUNTER — Other Ambulatory Visit (HOSPITAL_COMMUNITY)
Admission: RE | Admit: 2021-01-29 | Discharge: 2021-01-29 | Disposition: A | Discharge status: Home / Self Care | Payer: Medicaid Other | Source: Ambulatory Visit | Attending: Obstetrics and Gynecology | Admitting: Obstetrics and Gynecology

## 2021-01-29 ENCOUNTER — Encounter: Payer: Medicaid Other | Admitting: Obstetrics and Gynecology

## 2021-01-29 ENCOUNTER — Other Ambulatory Visit: Payer: Self-pay

## 2021-01-29 ENCOUNTER — Ambulatory Visit (INDEPENDENT_AMBULATORY_CARE_PROVIDER_SITE_OTHER): Payer: Medicaid Other | Admitting: Certified Nurse Midwife

## 2021-01-29 VITALS — BP 126/89 | HR 87 | Wt 163.4 lb

## 2021-01-29 DIAGNOSIS — Z3492 Encounter for supervision of normal pregnancy, unspecified, second trimester: Secondary | ICD-10-CM

## 2021-01-29 DIAGNOSIS — Z3A15 15 weeks gestation of pregnancy: Secondary | ICD-10-CM

## 2021-01-29 DIAGNOSIS — F4323 Adjustment disorder with mixed anxiety and depressed mood: Secondary | ICD-10-CM

## 2021-01-29 DIAGNOSIS — J302 Other seasonal allergic rhinitis: Secondary | ICD-10-CM

## 2021-01-29 DIAGNOSIS — I10 Essential (primary) hypertension: Secondary | ICD-10-CM

## 2021-01-29 MED ORDER — NIFEDIPINE ER OSMOTIC RELEASE 30 MG PO TB24: 30.0000 mg | ORAL_TABLET | Freq: Every day | ORAL | 5 refills | Status: DC

## 2021-01-29 MED ORDER — BLOOD PRESSURE KIT DEVI: 1.0000 | 0 refills | Status: DC

## 2021-01-29 MED ORDER — PRENATAL 27-0.8 MG PO TABS: 1.0000 | ORAL_TABLET | Freq: Every day | ORAL | 3 refills | Status: DC

## 2021-01-29 MED ORDER — ASPIRIN EC 81 MG PO TBEC: 81.0000 mg | DELAYED_RELEASE_TABLET | Freq: Every day | ORAL | 2 refills | Status: DC

## 2021-01-29 MED ORDER — CETIRIZINE HCL 10 MG PO TABS: 10.0000 mg | ORAL_TABLET | Freq: Every day | ORAL | 5 refills | Status: DC

## 2021-01-30 MED ORDER — PRENATAL 27-0.8 MG PO TABS: 1.0000 | ORAL_TABLET | Freq: Every day | ORAL | 3 refills | Status: DC

## 2021-01-30 NOTE — Addendum Note (Signed)
Addended by: Edd Arbour on: 01/30/2021 10:24 AM   Modules accepted: Orders
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
## 2021-01-31 ENCOUNTER — Encounter: Payer: Self-pay | Admitting: *Deleted

## 2021-01-31 LAB — CULTURE, OB URINE

## 2021-01-31 LAB — URINE CULTURE, OB REFLEX

## 2021-02-01 LAB — AFP, SERUM, OPEN SPINA BIFIDA
AFP MoM: 1.08
AFP Value: 35.7 ng/mL
Gest. Age on Collection Date: 15.3 weeks
Maternal Age At EDD: 33.2 yr
OSBR Risk 1 IN: 10000
Test Results:: NEGATIVE
Weight: 163 [lb_av]

## 2021-02-01 LAB — CBC/D/PLT+RPR+RH+ABO+RUBIGG...
Antibody Screen: NEGATIVE
Basophils Absolute: 0 10*3/uL (ref 0.0–0.2)
Basos: 0 %
EOS (ABSOLUTE): 0.1 10*3/uL (ref 0.0–0.4)
Eos: 1 %
HCV Ab: 0.1 s/co ratio (ref 0.0–0.9)
HIV Screen 4th Generation wRfx: NONREACTIVE
Hematocrit: 37.3 % (ref 34.0–46.6)
Hemoglobin: 12.6 g/dL (ref 11.1–15.9)
Hepatitis B Surface Ag: NEGATIVE
Immature Grans (Abs): 0 10*3/uL (ref 0.0–0.1)
Immature Granulocytes: 1 %
Lymphocytes Absolute: 2.3 10*3/uL (ref 0.7–3.1)
Lymphs: 29 %
MCH: 29.2 pg (ref 26.6–33.0)
MCHC: 33.8 g/dL (ref 31.5–35.7)
MCV: 86 fL (ref 79–97)
Monocytes Absolute: 0.6 10*3/uL (ref 0.1–0.9)
Monocytes: 8 %
Neutrophils Absolute: 4.9 10*3/uL (ref 1.4–7.0)
Neutrophils: 61 %
Platelets: 388 10*3/uL (ref 150–450)
RBC: 4.32 x10E6/uL (ref 3.77–5.28)
RDW: 14.2 % (ref 11.7–15.4)
RPR Ser Ql: REACTIVE — AB
Rh Factor: POSITIVE
Rubella Antibodies, IGG: 3.88 index (ref >0.99)
WBC: 7.9 10*3/uL (ref 3.4–10.8)

## 2021-02-01 LAB — HCV INTERPRETATION

## 2021-02-01 LAB — RPR, QUANT+TP ABS (REFLEX)
Rapid Plasma Reagin, Quant: 1:1 {titer} — ABNORMAL HIGH
T Pallidum Abs: REACTIVE — AB

## 2021-02-01 LAB — HEMOGLOBIN A1C
Est. average glucose Bld gHb Est-mCnc: 111 mg/dL
Hgb A1c MFr Bld: 5.5 % (ref 4.8–5.6)

## 2021-02-04 LAB — CYTOLOGY - PAP
Comment: NEGATIVE
Diagnosis: NEGATIVE
High risk HPV: NEGATIVE

## 2021-02-05 ENCOUNTER — Encounter: Payer: Self-pay | Admitting: Obstetrics and Gynecology

## 2021-02-05 ENCOUNTER — Telehealth: Payer: Self-pay | Admitting: Obstetrics and Gynecology

## 2021-02-05 DIAGNOSIS — A530 Latent syphilis, unspecified as early or late: Secondary | ICD-10-CM

## 2021-02-05 HISTORY — DX: Latent syphilis, unspecified as early or late: A53.0

## 2021-02-05 NOTE — Telephone Encounter (Signed)
+   RRP + TPAL 1:1 ratio. Likely a past infection. Attempted to call patient to confirm. No answer, no option to leave message.   Duane Lope, NP 02/05/2021 3:24 PM

## 2021-02-19 ENCOUNTER — Telehealth: Payer: Self-pay | Admitting: Lactation Services

## 2021-02-19 NOTE — Telephone Encounter (Signed)
Called patient with results of [facility] Carrier Screening showing that she has increased carrier risk for SMA. Received a message that call could not be completed at this time x 2 on mobile phone number listed for patient. Called home phone number and LM for patient to call the office for non urgent results. My Chart message sent also.

## 2021-02-20 ENCOUNTER — Encounter: Payer: Self-pay | Admitting: *Deleted

## 2021-02-26 ENCOUNTER — Other Ambulatory Visit: Payer: Self-pay

## 2021-02-26 ENCOUNTER — Other Ambulatory Visit: Payer: Self-pay | Admitting: *Deleted

## 2021-02-26 ENCOUNTER — Ambulatory Visit: Payer: Medicaid Other | Attending: Obstetrics and Gynecology

## 2021-02-26 ENCOUNTER — Ambulatory Visit: Payer: Medicaid Other | Admitting: *Deleted

## 2021-02-26 ENCOUNTER — Ambulatory Visit: Payer: Medicaid Other | Attending: Obstetrics and Gynecology | Admitting: Obstetrics and Gynecology

## 2021-02-26 ENCOUNTER — Encounter: Payer: Self-pay | Admitting: *Deleted

## 2021-02-26 VITALS — BP 124/74 | HR 88

## 2021-02-26 DIAGNOSIS — Z148 Genetic carrier of other disease: Secondary | ICD-10-CM

## 2021-02-26 DIAGNOSIS — O0990 Supervision of high risk pregnancy, unspecified, unspecified trimester: Secondary | ICD-10-CM | POA: Diagnosis not present

## 2021-02-26 DIAGNOSIS — Z3A19 19 weeks gestation of pregnancy: Secondary | ICD-10-CM

## 2021-02-26 DIAGNOSIS — O10012 Pre-existing essential hypertension complicating pregnancy, second trimester: Secondary | ICD-10-CM

## 2021-02-26 DIAGNOSIS — O10912 Unspecified pre-existing hypertension complicating pregnancy, second trimester: Secondary | ICD-10-CM

## 2021-02-26 DIAGNOSIS — O283 Abnormal ultrasonic finding on antenatal screening of mother: Secondary | ICD-10-CM | POA: Diagnosis not present

## 2021-02-26 DIAGNOSIS — A530 Latent syphilis, unspecified as early or late: Secondary | ICD-10-CM

## 2021-02-26 NOTE — Progress Notes (Addendum)
Maternal-Fetal Medicine   Name: Tracy Black DOB: 06/28/1986 MRN: [id number] Referring Provider: Nettie Elm, MD  I had the pleasure of seeing Tracy [facility] at [facility]. She is G4 P2 at 19w 3d gestation and is here for fetal anatomy scan.  Past medical history significant for chronic hypertension.  Patient takes nifedipine for control.  She does not have diabetes or any other chronic medical conditions.  Past surgical history: Wisdom tooth surgery.  Medications: Nifedipine XL 30 mg once daily, aspirin 81 mg once daily, prenatal vitamins. Allergies: Lisinopril (lip swelling).  Social history: Denies tobacco or drug or alcohol use.  Obstetrical history significant for 2 term vaginal deliveries.    Prenatal course: On cell free fetal DNA screening, the risks of fetal aneuploidies are not increased.  MSAFP screening showed low risk for open neural tube defects.  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  Partner has not been screened.  He is a father of her second child and both her children are not affected with SMA.  Ultrasound We performed a fetal anatomical survey.  An echogenic intracardiac focus is seen.  No other markers of aneuploidies or fetal structural defects are seen.  Fetal biometry is consistent with the previously established dates.  Amniotic fluid is normal good fetal activity seen. Blood pressure today at her office is 124/74 mmHg.  Pulse 88/minute.  Our concerns include: Chronic hypertension -I counseled the patient on chronic hypertension and pregnancy outcomes. -Adverse outcomes from severe uncontrolled hypertension include maternal stroke, endorgan damage, coagulation disturbances and placental abruption. -Superimposed preeclampsia can occur in up to 30% of cases. -Discussed the safety profile of commonly used antihypertensive medications including nifedipine and labetalol. -I discussed the benefit of  low-dose aspirin prophylaxis in delaying or preventing preeclampsia. -Ultrasound protocol including serial fetal growth assessments and weekly antenatal testing from [redacted] weeks gestation were discussed.  Echogenic intracardiac focus Echogenic intracardiac focus is seen in about 2% to 3% of normal fetuses (more in Asian population), and in about 15%-20% of fetuses with Down syndrome.  Given that she had low risk for fetal Down syndrome on cell free fetal DNA screening, this should be considered a normal variant and not a marker for Down syndrome. I explained that only amniocentesis will give a definitive result on the fetal karyotype.  I discussed amniocentesis procedure and possible complication of miscarriage and 1 and 500 procedures.  Patient opted not to have amniocentesis.  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  It is characterized by progressive muscle weakness and atrophy due to the degeneration and loss of lower motor neurons in the spinal cord and brainstem. I informed her that prenatal diagnosis is available (amniocentesis).  I offered to make an appointment with our genetic counselor.  Patient would like to discuss with her partner and decide.  Recommendations -An appointment was made for her to return in 4 weeks for fetal growth assessment.  -Fetal growth assessment every 4 weeks. -Weekly BPP from [redacted] weeks gestation till delivery. -Consider delivery at 71 to [redacted] weeks gestation provider her blood pressures are well controlled.  Thank you for consultation.  If you have any questions or concerns, please contact me the [facility].  Consultation including face-to-face (more than 50%) counseling 45 minutes.

## 2021-03-09 ENCOUNTER — Emergency Department (HOSPITAL_COMMUNITY)
Admission: EM | Admit: 2021-03-09 | Discharge: 2021-03-09 | Disposition: A | Discharge status: Home / Self Care | Payer: Medicaid Other | Attending: Emergency Medicine | Admitting: Emergency Medicine

## 2021-03-09 ENCOUNTER — Other Ambulatory Visit: Payer: Self-pay

## 2021-03-09 ENCOUNTER — Encounter (HOSPITAL_COMMUNITY): Payer: Self-pay | Admitting: *Deleted

## 2021-03-09 DIAGNOSIS — R000 Tachycardia, unspecified: Secondary | ICD-10-CM | POA: Diagnosis not present

## 2021-03-09 DIAGNOSIS — Z3A21 21 weeks gestation of pregnancy: Secondary | ICD-10-CM | POA: Insufficient documentation

## 2021-03-09 DIAGNOSIS — Z7982 Long term (current) use of aspirin: Secondary | ICD-10-CM | POA: Insufficient documentation

## 2021-03-09 DIAGNOSIS — O219 Vomiting of pregnancy, unspecified: Secondary | ICD-10-CM | POA: Insufficient documentation

## 2021-03-09 DIAGNOSIS — I10 Essential (primary) hypertension: Secondary | ICD-10-CM | POA: Insufficient documentation

## 2021-03-09 DIAGNOSIS — R030 Elevated blood-pressure reading, without diagnosis of hypertension: Secondary | ICD-10-CM

## 2021-03-09 LAB — COMPREHENSIVE METABOLIC PANEL
ALT: 16 U/L (ref 0–44)
AST: 30 U/L (ref 15–41)
Albumin: 3.6 g/dL (ref 3.5–5.0)
Alkaline Phosphatase: 63 U/L (ref 38–126)
Anion gap: 9 (ref 5–15)
BUN: 9 mg/dL (ref 6–20)
CO2: 24 mmol/L (ref 22–32)
Calcium: 9.8 mg/dL (ref 8.9–10.3)
Chloride: 106 mmol/L (ref 98–111)
Creatinine, Ser: 0.73 mg/dL (ref 0.44–1.00)
GFR, Estimated: >60 mL/min (ref >60)
Glucose, Bld: 115 mg/dL — ABNORMAL HIGH (ref 70–99)
Potassium: 4.3 mmol/L (ref 3.5–5.1)
Sodium: 139 mmol/L (ref 135–145)
Total Bilirubin: 0.9 mg/dL (ref 0.3–1.2)
Total Protein: 7.2 g/dL (ref 6.5–8.1)

## 2021-03-09 LAB — URINALYSIS, ROUTINE W REFLEX MICROSCOPIC
Bilirubin Urine: NEGATIVE
Glucose, UA: NEGATIVE mg/dL
Hgb urine dipstick: NEGATIVE
Ketones, ur: NEGATIVE mg/dL
Leukocytes,Ua: NEGATIVE
Nitrite: NEGATIVE
Protein, ur: NEGATIVE mg/dL
Specific Gravity, Urine: 1.02 (ref 1.005–1.030)
pH: 6 (ref 5.0–8.0)

## 2021-03-09 LAB — CBC
HCT: 32.7 % — ABNORMAL LOW (ref 36.0–46.0)
Hemoglobin: 10.7 g/dL — ABNORMAL LOW (ref 12.0–15.0)
MCH: 30.1 pg (ref 26.0–34.0)
MCHC: 32.7 g/dL (ref 30.0–36.0)
MCV: 91.9 fL (ref 80.0–100.0)
Platelets: 418 10*3/uL — ABNORMAL HIGH (ref 150–400)
RBC: 3.56 MIL/uL — ABNORMAL LOW (ref 3.87–5.11)
RDW: 14.1 % (ref 11.5–15.5)
WBC: 12.4 10*3/uL — ABNORMAL HIGH (ref 4.0–10.5)
nRBC: 0 % (ref 0.0–0.2)

## 2021-03-09 LAB — I-STAT BETA HCG BLOOD, ED (MC, WL, AP ONLY): I-stat hCG, quantitative: >2000 m[IU]/mL — ABNORMAL HIGH (ref <5)

## 2021-03-09 LAB — LIPASE, BLOOD: Lipase: 25 U/L (ref 11–51)

## 2021-03-09 LAB — POC OCCULT BLOOD, ED: Fecal Occult Bld: NEGATIVE

## 2021-03-09 NOTE — ED Provider Notes (Signed)
[facility] EMERGENCY DEPARTMENT Provider Note   CSN: [id number] Arrival date & time: 03/09/21  1925     History Chief Complaint  Patient presents with   dark stool    Tracy Black is a 35 y.o. female.  35 year old female presents at [redacted] weeks gestation with multiple complaints. Reports having an episode of vomiting yesterday containing Kulish/mucous as well as 1 stool that was black, no further black stools or emesis since that 1 event. Also reports rectal itching, feels like her ears are swollen, RUQ discomfort earlier, none at this time, also itching of her skin without rash. Denies vaginal bleeding or discharge. Patient has a history of hypertension, reports compliance with medications and BP monitoring at home has been normal. Patient has recently taken in a new animal and reports the animals in her house are now losing their fur and she is concerned she may have some sort of parasite.       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   Supervision of high risk pregnancy, antepartum 01/21/2021   Angio-edema 04/22/2019   Essential hypertension 04/22/2019    Past Surgical History:  Procedure Laterality Date   WISDOM TOOTH EXTRACTION       OB History     Gravida  4   Para  2   Term  2   Preterm      AB  1   Living  2      SAB  1   IAB      Ectopic      Multiple      Live Births  2           Family History  Problem Relation Age of Onset   Cancer Maternal Grandmother     Social History   Tobacco Use   Smoking status: Never   Smokeless tobacco: Never  Vaping Use   Vaping Use: Never used  Substance Use Topics   Alcohol use: No   Drug use: No    Home Medications Prior to Admission medications   Medication Sig Start Date End Date Taking? Authorizing Provider  aspirin EC 81 MG tablet Take 1 tablet (81 mg total) by mouth  daily. Take after 12 weeks for prevention of preeclampsia later in pregnancy 01/29/21   Gabriel Carina, CNM  Blood Pressure Monitoring (BLOOD PRESSURE KIT) DEVI 1 each by Does not apply route once a week. 01/29/21   Gabriel Carina, CNM  cetirizine (ZYRTEC) 10 MG tablet Take 1 tablet (10 mg total) by mouth daily. 01/29/21   Gabriel Carina, CNM  dicloxacillin (DYNAPEN) 500 MG capsule Take 500 mg by mouth 4 (four) times daily. Patient not taking: Reported on 01/29/2021 08/14/20   [provider]  NIFEdipine (PROCARDIA XL) 30 MG 24 hr tablet Take 1 tablet (30 mg total) by mouth daily. 01/29/21   Gabriel Carina, CNM  Prenatal Vit-Fe Fumarate-FA (MULTIVITAMIN-PRENATAL) 27-0.8 MG TABS tablet Take 1 tablet by mouth daily after breakfast. 01/30/21   Gabriel Carina, CNM    Allergies    Lisinopril  Review of Systems   Review of Systems  Constitutional:  Negative for chills and fever.  Respiratory:  Negative for cough and shortness of breath.   Cardiovascular:  Negative for chest pain.  Gastrointestinal:  Positive for abdominal pain, nausea and vomiting. Negative for  constipation and diarrhea.  Genitourinary:  Negative for dysuria, vaginal bleeding and vaginal discharge.  Musculoskeletal:  Negative for arthralgias and myalgias.  Skin:  Negative for rash and wound.  Allergic/Immunologic: Positive for immunocompromised state.  Neurological:  Negative for weakness.  Hematological:  Negative for adenopathy.  Psychiatric/Behavioral:  Negative for confusion.   All other systems reviewed and are negative.  Physical Exam Updated Vital Signs BP (!) 129/93   Pulse (!) 103   Temp 98.9 F (37.2 C) (Oral)   Resp 18   Ht '5\' 7"'  (1.702 m)   Wt 73.9 kg   LMP 10/13/2020   SpO2 100%   BMI 25.53 kg/m   Physical Exam Vitals and nursing note reviewed. Exam conducted with a chaperone present.  Constitutional:      General: She is not in acute distress.    Appearance: She is well-developed.  She is not diaphoretic.  HENT:     Head: Normocephalic and atraumatic.     Nose: Nose normal.     Mouth/Throat:     Mouth: Mucous membranes are moist.  Eyes:     Conjunctiva/sclera: Conjunctivae normal.  Cardiovascular:     Rate and Rhythm: Regular rhythm. Tachycardia present.     Pulses: Normal pulses.     Heart sounds: Normal heart sounds.  Pulmonary:     Effort: Pulmonary effort is normal.     Breath sounds: Normal breath sounds.  Abdominal:     Palpations: Abdomen is soft.     Tenderness: There is no abdominal tenderness.     Comments: gravid  Genitourinary:    Rectum: Guaiac result negative. External hemorrhoid present. No tenderness.  Musculoskeletal:        General: No swelling or tenderness.     Cervical back: Neck supple.     Right lower leg: No edema.     Left lower leg: No edema.  Skin:    General: Skin is warm and dry.     Findings: No erythema or rash.  Neurological:     Mental Status: She is alert and oriented to person, place, and time.  Psychiatric:        Behavior: Behavior normal.    ED Results / Procedures / Treatments   Labs (all labs ordered are listed, but only abnormal results are displayed) Labs Reviewed  COMPREHENSIVE METABOLIC PANEL - Abnormal; Notable for the following components:      Result Value   Glucose, Bld 115 (*)    All other components within normal limits  CBC - Abnormal; Notable for the following components:   WBC 12.4 (*)    RBC 3.56 (*)    Hemoglobin 10.7 (*)    HCT 32.7 (*)    Platelets 418 (*)    All other components within normal limits  URINALYSIS, ROUTINE W REFLEX MICROSCOPIC - Abnormal; Notable for the following components:   APPearance HAZY (*)    All other components within normal limits  I-STAT BETA HCG BLOOD, ED (MC, WL, AP ONLY) - Abnormal; Notable for the following components:   I-stat hCG, quantitative >2,000.0 (*)    All other components within normal limits  LIPASE, BLOOD  POC OCCULT BLOOD, ED     EKG None  Radiology No results found.  Procedures Procedures   Medications Ordered in ED Medications - No data to display  ED Course  I have reviewed the triage vital signs and the nursing notes.  Pertinent labs & imaging results that were available during my care  of the patient were reviewed by me and considered in my medical decision making (see chart for details).  Clinical Course as of 03/09/21 2231  Nancy Fetter Mar 09, 6158  [id number] 35 year old female with various complaints as above, well-appearing on exam, abdomen is soft and nontender, gravid.  Chaperone present for rectal exam, scant amount of soft Min[place] stool in the vault which is Hemoccult negative.  Vitals reviewed, she is hypertensive on arrival, blood pressure improving by discharge at 129/93.  Patient reports compliance with medications, monitors her blood pressure from home and will continue to do so and follow-up with her OB tomorrow. Urinalysis is unremarkable, CBC with mild anemia with hemoglobin of 10.7 likely related to her pregnancy.  Lipase within normal meds.  Hemoccult negative.  CMP unremarkable including normal LFTs. [LM]    Clinical Course User Index [LM] Roque Lias   MDM Rules/Calculators/A&P                            Final Clinical Impression(s) / ED Diagnoses Final diagnoses:  Elevated blood pressure reading    Rx / DC Orders ED Discharge Orders     None        Roque Lias 03/09/21 2231    Valarie Merino, MD 03/10/21 2249

## 2021-03-09 NOTE — Discharge Instructions (Addendum)
Follow up with your OB regarding concerns for infection from animals.  Your workup today is reassuring. Continue to take your blood pressure medication as prescribed and monitor your blood pressure.

## 2021-03-09 NOTE — ED Triage Notes (Signed)
PT states black stool since yesterday.  Also states she vomited up Rumble "stuff" and experienced RUQ pain. Denies presently.  Also c/o rectal itching and rash on legs.  States she is [redacted] weeks pregnant and states baby has been moving fine.

## 2021-03-21 ENCOUNTER — Encounter: Payer: Self-pay | Admitting: Obstetrics and Gynecology

## 2021-03-21 NOTE — Progress Notes (Signed)
Received call from [facility] for elevated BP. Attempted to call pt, no answer, unable to LVM.

## 2021-03-26 ENCOUNTER — Ambulatory Visit: Payer: Medicaid Other

## 2021-04-11 ENCOUNTER — Other Ambulatory Visit: Payer: Self-pay

## 2021-04-11 ENCOUNTER — Emergency Department (HOSPITAL_COMMUNITY): Payer: Medicaid Other

## 2021-04-11 ENCOUNTER — Inpatient Hospital Stay (HOSPITAL_COMMUNITY)
Admission: EM | Admit: 2021-04-11 | Discharge: 2021-04-13 | DRG: 831 | Disposition: A | Discharge status: Home / Self Care | Payer: Medicaid Other | Attending: Obstetrics and Gynecology | Admitting: Obstetrics and Gynecology

## 2021-04-11 DIAGNOSIS — O10012 Pre-existing essential hypertension complicating pregnancy, second trimester: Secondary | ICD-10-CM | POA: Diagnosis present

## 2021-04-11 DIAGNOSIS — R4020 Unspecified coma: Secondary | ICD-10-CM | POA: Diagnosis present

## 2021-04-11 DIAGNOSIS — Z20822 Contact with and (suspected) exposure to covid-19: Secondary | ICD-10-CM | POA: Diagnosis present

## 2021-04-11 DIAGNOSIS — Y92410 Unspecified street and highway as the place of occurrence of the external cause: Secondary | ICD-10-CM

## 2021-04-11 DIAGNOSIS — O99012 Anemia complicating pregnancy, second trimester: Secondary | ICD-10-CM | POA: Diagnosis present

## 2021-04-11 DIAGNOSIS — T40601A Poisoning by unspecified narcotics, accidental (unintentional), initial encounter: Secondary | ICD-10-CM | POA: Diagnosis present

## 2021-04-11 DIAGNOSIS — R0681 Apnea, not elsewhere classified: Secondary | ICD-10-CM | POA: Diagnosis present

## 2021-04-11 DIAGNOSIS — O9A212 Injury, poisoning and certain other consequences of external causes complicating pregnancy, second trimester: Secondary | ICD-10-CM | POA: Diagnosis not present

## 2021-04-11 DIAGNOSIS — O99282 Endocrine, nutritional and metabolic diseases complicating pregnancy, second trimester: Secondary | ICD-10-CM | POA: Diagnosis present

## 2021-04-11 DIAGNOSIS — O139 Gestational [pregnancy-induced] hypertension without significant proteinuria, unspecified trimester: Secondary | ICD-10-CM | POA: Diagnosis not present

## 2021-04-11 DIAGNOSIS — T50901A Poisoning by unspecified drugs, medicaments and biological substances, accidental (unintentional), initial encounter: Secondary | ICD-10-CM | POA: Diagnosis not present

## 2021-04-11 DIAGNOSIS — Z3A24 24 weeks gestation of pregnancy: Secondary | ICD-10-CM

## 2021-04-11 DIAGNOSIS — O10919 Unspecified pre-existing hypertension complicating pregnancy, unspecified trimester: Secondary | ICD-10-CM

## 2021-04-11 DIAGNOSIS — E876 Hypokalemia: Secondary | ICD-10-CM | POA: Diagnosis present

## 2021-04-11 DIAGNOSIS — O26892 Other specified pregnancy related conditions, second trimester: Secondary | ICD-10-CM | POA: Diagnosis present

## 2021-04-11 DIAGNOSIS — Z3A25 25 weeks gestation of pregnancy: Secondary | ICD-10-CM | POA: Diagnosis not present

## 2021-04-11 DIAGNOSIS — I10 Essential (primary) hypertension: Secondary | ICD-10-CM | POA: Diagnosis present

## 2021-04-11 DIAGNOSIS — T40716A Underdosing of cannabis, initial encounter: Secondary | ICD-10-CM | POA: Diagnosis not present

## 2021-04-11 HISTORY — DX: Gestational (pregnancy-induced) hypertension without significant proteinuria, unspecified trimester: O13.9

## 2021-04-11 LAB — CBC
HCT: 31.1 % — ABNORMAL LOW (ref 36.0–46.0)
Hemoglobin: 10 g/dL — ABNORMAL LOW (ref 12.0–15.0)
MCH: 30.1 pg (ref 26.0–34.0)
MCHC: 32.2 g/dL (ref 30.0–36.0)
MCV: 93.7 fL (ref 80.0–100.0)
Platelets: 401 10*3/uL — ABNORMAL HIGH (ref 150–400)
RBC: 3.32 MIL/uL — ABNORMAL LOW (ref 3.87–5.11)
RDW: 14 % (ref 11.5–15.5)
WBC: 7.5 10*3/uL (ref 4.0–10.5)
nRBC: 0 % (ref 0.0–0.2)

## 2021-04-11 LAB — COMPREHENSIVE METABOLIC PANEL
ALT: 5 U/L (ref 0–44)
AST: 22 U/L (ref 15–41)
Albumin: 2.9 g/dL — ABNORMAL LOW (ref 3.5–5.0)
Alkaline Phosphatase: 55 U/L (ref 38–126)
Anion gap: 10 (ref 5–15)
BUN: 5 mg/dL — ABNORMAL LOW (ref 6–20)
CO2: 18 mmol/L — ABNORMAL LOW (ref 22–32)
Calcium: 8.2 mg/dL — ABNORMAL LOW (ref 8.9–10.3)
Chloride: 105 mmol/L (ref 98–111)
Creatinine, Ser: 0.66 mg/dL (ref 0.44–1.00)
GFR, Estimated: >60 mL/min (ref >60)
Glucose, Bld: 150 mg/dL — ABNORMAL HIGH (ref 70–99)
Potassium: 3.6 mmol/L (ref 3.5–5.1)
Sodium: 133 mmol/L — ABNORMAL LOW (ref 135–145)
Total Bilirubin: 0.9 mg/dL (ref 0.3–1.2)
Total Protein: 6 g/dL — ABNORMAL LOW (ref 6.5–8.1)

## 2021-04-11 LAB — I-STAT CHEM 8, ED
BUN: 4 mg/dL — ABNORMAL LOW (ref 6–20)
Calcium, Ion: 1.03 mmol/L — ABNORMAL LOW (ref 1.15–1.40)
Chloride: 104 mmol/L (ref 98–111)
Creatinine, Ser: 0.5 mg/dL (ref 0.44–1.00)
Glucose, Bld: 153 mg/dL — ABNORMAL HIGH (ref 70–99)
HCT: 30 % — ABNORMAL LOW (ref 36.0–46.0)
Hemoglobin: 10.2 g/dL — ABNORMAL LOW (ref 12.0–15.0)
Potassium: 3.6 mmol/L (ref 3.5–5.1)
Sodium: 135 mmol/L (ref 135–145)
TCO2: 20 mmol/L — ABNORMAL LOW (ref 22–32)

## 2021-04-11 LAB — URINALYSIS, ROUTINE W REFLEX MICROSCOPIC
Bacteria, UA: NONE SEEN
Bilirubin Urine: NEGATIVE
Glucose, UA: NEGATIVE mg/dL
Hgb urine dipstick: NEGATIVE
Ketones, ur: NEGATIVE mg/dL
Leukocytes,Ua: NEGATIVE
Nitrite: NEGATIVE
Protein, ur: 30 mg/dL — AB
Specific Gravity, Urine: 1.029 (ref 1.005–1.030)
pH: 7 (ref 5.0–8.0)

## 2021-04-11 LAB — PROTIME-INR
INR: 1 (ref 0.8–1.2)
Prothrombin Time: 12.9 seconds (ref 11.4–15.2)

## 2021-04-11 LAB — RESP PANEL BY RT-PCR (FLU A&B, COVID) ARPGX2
Influenza A by PCR: NEGATIVE
Influenza B by PCR: NEGATIVE
SARS Coronavirus 2 by RT PCR: NEGATIVE

## 2021-04-11 LAB — PROTEIN / CREATININE RATIO, URINE
Creatinine, Urine: 49.55 mg/dL
Protein Creatinine Ratio: 0.91 mg/mg{Cre} — ABNORMAL HIGH (ref 0.00–0.15)
Total Protein, Urine: 45 mg/dL

## 2021-04-11 LAB — RAPID URINE DRUG SCREEN, HOSP PERFORMED
Amphetamines: NOT DETECTED
Barbiturates: NOT DETECTED
Benzodiazepines: NOT DETECTED
Cocaine: NOT DETECTED
Opiates: NOT DETECTED
Tetrahydrocannabinol: POSITIVE — AB

## 2021-04-11 LAB — TYPE AND SCREEN
ABO/RH(D): O POS
Antibody Screen: NEGATIVE

## 2021-04-11 LAB — CBG MONITORING, ED: Glucose-Capillary: 141 mg/dL — ABNORMAL HIGH (ref 70–99)

## 2021-04-11 LAB — KLEIHAUER-BETKE STAIN
Fetal Cells %: 0 %
Quantitation Fetal Hemoglobin: 0 mL

## 2021-04-11 LAB — ETHANOL: Alcohol, Ethyl (B): <10 mg/dL (ref <10)

## 2021-04-11 LAB — HIV ANTIBODY (ROUTINE TESTING W REFLEX): HIV Screen 4th Generation wRfx: NONREACTIVE

## 2021-04-11 LAB — MRSA NEXT GEN BY PCR, NASAL: MRSA by PCR Next Gen: NOT DETECTED

## 2021-04-11 LAB — LACTIC ACID, PLASMA: Lactic Acid, Venous: 2.4 mmol/L (ref 0.5–1.9)

## 2021-04-11 MED ORDER — POLYETHYLENE GLYCOL 3350 17 G PO PACK: 17.0000 g | PACK | Freq: Every day | ORAL | Status: DC | PRN

## 2021-04-11 MED ORDER — LACTATED RINGERS IV SOLN: INTRAVENOUS | Status: DC

## 2021-04-11 MED ORDER — ONDANSETRON HCL 4 MG/2ML IJ SOLN
INTRAMUSCULAR | Status: AC
  Administered 2021-04-11: 4 mg via INTRAVENOUS
  Filled 2021-04-11: qty 2

## 2021-04-11 MED ORDER — NALOXONE HCL 2 MG/2ML IJ SOSY
PREFILLED_SYRINGE | INTRAMUSCULAR | Status: AC | PRN
  Administered 2021-04-11: 2 mg via INTRAVENOUS

## 2021-04-11 MED ORDER — IOHEXOL 300 MG/ML  SOLN
100.0000 mL | Freq: Once | INTRAMUSCULAR | Status: AC | PRN
  Administered 2021-04-11: 100 mL via INTRAVENOUS

## 2021-04-11 MED ORDER — NALOXONE HCL 4 MG/10ML IJ SOLN
0.0000 mg/h | INTRAVENOUS | Status: DC
  Administered 2021-04-11: 1.8 mg/h via INTRAVENOUS
  Administered 2021-04-11: 1.6 mg/h via INTRAVENOUS
  Administered 2021-04-11: 1.8 mg/h via INTRAVENOUS
  Administered 2021-04-12: 1 mg/h via INTRAVENOUS
  Filled 2021-04-11 (×7): qty 10

## 2021-04-11 MED ORDER — NALOXONE HCL 2 MG/2ML IJ SOSY
PREFILLED_SYRINGE | INTRAMUSCULAR | Status: AC
  Administered 2021-04-11: 1 mg via INTRAVENOUS
  Filled 2021-04-11: qty 4

## 2021-04-11 MED ORDER — ONDANSETRON HCL 4 MG/2ML IJ SOLN: 4.0000 mg | Freq: Once | INTRAMUSCULAR | Status: AC

## 2021-04-11 MED ORDER — DOCUSATE SODIUM 100 MG PO CAPS: 100.0000 mg | ORAL_CAPSULE | Freq: Two times a day (BID) | ORAL | Status: DC | PRN

## 2021-04-11 MED ORDER — NALOXONE HCL 0.4 MG/ML IJ SOLN: 0.1000 mg | INTRAMUSCULAR | Status: DC
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
## 2021-04-11 NOTE — Progress Notes (Signed)
Spoke with OB Rapid RN and OB specialty care charge RN regarding doppler fht. They are aware of this patient and Ob Specialty care will be coming to complete this tonight.

## 2021-04-11 NOTE — ED Notes (Signed)
Consent for CT scan with contrast signed by pt.

## 2021-04-11 NOTE — ED Notes (Signed)
Pt respiratory rate dropping to 7, pt becoming drowsy again. EDP made aware, pt given 2 mg narcan.

## 2021-04-11 NOTE — H&P (Signed)
See Critical Care Consult note 04/11/21.  Melody Comas, MD [facility] Office: [phone]   See Amion for personal pager PCCM on call pager [phone] until 7pm. Please call Elink 7p-7a. [phone]

## 2021-04-11 NOTE — Progress Notes (Signed)
Dr Vergie Living notified of additional doses of narcan that had been given and decrease in fhr baseline during monitoring.  He says this could be caused by a number of things, but as long as the variability is reassuring for a 25wk fetus and no decels are occurring he is alright with tracing.  MD says to continue monitoring fhr.
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
## 2021-04-11 NOTE — Progress Notes (Signed)
Dr Donavan Foil notified of UDS and lab results
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
## 2021-04-12 ENCOUNTER — Encounter (HOSPITAL_COMMUNITY): Payer: Self-pay | Admitting: *Deleted

## 2021-04-12 ENCOUNTER — Inpatient Hospital Stay (HOSPITAL_BASED_OUTPATIENT_CLINIC_OR_DEPARTMENT_OTHER): Payer: Medicaid Other

## 2021-04-12 ENCOUNTER — Encounter: Payer: Self-pay | Admitting: Obstetrics and Gynecology

## 2021-04-12 DIAGNOSIS — O9A212 Injury, poisoning and certain other consequences of external causes complicating pregnancy, second trimester: Secondary | ICD-10-CM | POA: Diagnosis not present

## 2021-04-12 DIAGNOSIS — T40716A Underdosing of cannabis, initial encounter: Secondary | ICD-10-CM

## 2021-04-12 DIAGNOSIS — T40601A Poisoning by unspecified narcotics, accidental (unintentional), initial encounter: Secondary | ICD-10-CM | POA: Diagnosis present

## 2021-04-12 DIAGNOSIS — Z3689 Encounter for other specified antenatal screening: Secondary | ICD-10-CM

## 2021-04-12 DIAGNOSIS — T50901A Poisoning by unspecified drugs, medicaments and biological substances, accidental (unintentional), initial encounter: Secondary | ICD-10-CM

## 2021-04-12 DIAGNOSIS — O139 Gestational [pregnancy-induced] hypertension without significant proteinuria, unspecified trimester: Secondary | ICD-10-CM

## 2021-04-12 DIAGNOSIS — I10 Essential (primary) hypertension: Secondary | ICD-10-CM

## 2021-04-12 DIAGNOSIS — Z3A25 25 weeks gestation of pregnancy: Secondary | ICD-10-CM

## 2021-04-12 DIAGNOSIS — Z3A24 24 weeks gestation of pregnancy: Secondary | ICD-10-CM

## 2021-04-12 DIAGNOSIS — O285 Abnormal chromosomal and genetic finding on antenatal screening of mother: Secondary | ICD-10-CM | POA: Insufficient documentation

## 2021-04-12 DIAGNOSIS — O10012 Pre-existing essential hypertension complicating pregnancy, second trimester: Secondary | ICD-10-CM | POA: Diagnosis not present

## 2021-04-12 DIAGNOSIS — O0970 Supervision of high risk pregnancy due to social problems, unspecified trimester: Secondary | ICD-10-CM | POA: Insufficient documentation

## 2021-04-12 DIAGNOSIS — O10919 Unspecified pre-existing hypertension complicating pregnancy, unspecified trimester: Secondary | ICD-10-CM | POA: Insufficient documentation

## 2021-04-12 HISTORY — DX: Essential (primary) hypertension: I10

## 2021-04-12 LAB — BASIC METABOLIC PANEL
Anion gap: 7 (ref 5–15)
Anion gap: 6 (ref 5–15)
BUN: <5 mg/dL — ABNORMAL LOW (ref 6–20)
BUN: <5 mg/dL — ABNORMAL LOW (ref 6–20)
CO2: 22 mmol/L (ref 22–32)
CO2: 24 mmol/L (ref 22–32)
Calcium: 8.5 mg/dL — ABNORMAL LOW (ref 8.9–10.3)
Calcium: 8.7 mg/dL — ABNORMAL LOW (ref 8.9–10.3)
Chloride: 106 mmol/L (ref 98–111)
Chloride: 104 mmol/L (ref 98–111)
Creatinine, Ser: 0.57 mg/dL (ref 0.44–1.00)
Creatinine, Ser: 0.63 mg/dL (ref 0.44–1.00)
GFR, Estimated: >60 mL/min (ref >60)
GFR, Estimated: >60 mL/min (ref >60)
Glucose, Bld: 95 mg/dL (ref 70–99)
Glucose, Bld: 75 mg/dL (ref 70–99)
Potassium: 3.3 mmol/L — ABNORMAL LOW (ref 3.5–5.1)
Potassium: 3.8 mmol/L (ref 3.5–5.1)
Sodium: 135 mmol/L (ref 135–145)
Sodium: 134 mmol/L — ABNORMAL LOW (ref 135–145)

## 2021-04-12 LAB — CBC
HCT: 27.9 % — ABNORMAL LOW (ref 36.0–46.0)
Hemoglobin: 9.5 g/dL — ABNORMAL LOW (ref 12.0–15.0)
MCH: 30.8 pg (ref 26.0–34.0)
MCHC: 34.1 g/dL (ref 30.0–36.0)
MCV: 90.6 fL (ref 80.0–100.0)
Platelets: 327 10*3/uL (ref 150–400)
RBC: 3.08 MIL/uL — ABNORMAL LOW (ref 3.87–5.11)
RDW: 13.9 % (ref 11.5–15.5)
WBC: 11.5 10*3/uL — ABNORMAL HIGH (ref 4.0–10.5)
nRBC: 0 % (ref 0.0–0.2)

## 2021-04-12 LAB — LACTIC ACID, PLASMA: Lactic Acid, Venous: 0.8 mmol/L (ref 0.5–1.9)

## 2021-04-12 LAB — MAGNESIUM: Magnesium: 1.7 mg/dL (ref 1.7–2.4)

## 2021-04-12 LAB — RPR: RPR Ser Ql: NONREACTIVE

## 2021-04-12 MED ORDER — NALOXONE HCL 0.4 MG/ML IJ SOLN: 0.4000 mg | INTRAMUSCULAR | Status: DC | PRN

## 2021-04-12 MED ORDER — PRENATAL 27-0.8 MG PO TABS: 1.0000 | ORAL_TABLET | Freq: Every morning | ORAL | Status: DC

## 2021-04-12 MED ORDER — MAGNESIUM SULFATE 2 GM/50ML IV SOLN
2.0000 g | Freq: Once | INTRAVENOUS | Status: AC
  Administered 2021-04-12: 2 g via INTRAVENOUS
  Filled 2021-04-12: qty 50

## 2021-04-12 MED ORDER — NIFEDIPINE ER OSMOTIC RELEASE 30 MG PO TB24
30.0000 mg | ORAL_TABLET | Freq: Every day | ORAL | Status: DC
  Administered 2021-04-13: 30 mg via ORAL
  Filled 2021-04-12: qty 1

## 2021-04-12 MED ORDER — ASPIRIN 81 MG PO CHEW
81.0000 mg | CHEWABLE_TABLET | Freq: Every day | ORAL | Status: DC
  Administered 2021-04-13: 81 mg via ORAL
  Filled 2021-04-12: qty 1

## 2021-04-12 MED ORDER — POTASSIUM CHLORIDE CRYS ER 20 MEQ PO TBCR: 20.0000 meq | EXTENDED_RELEASE_TABLET | Freq: Two times a day (BID) | ORAL | Status: DC

## 2021-04-12 MED ORDER — POTASSIUM CHLORIDE 10 MEQ/100ML IV SOLN
10.0000 meq | INTRAVENOUS | Status: DC
  Administered 2021-04-12 (×6): 10 meq via INTRAVENOUS
  Filled 2021-04-12 (×6): qty 100

## 2021-04-12 MED ORDER — PRENATAL MULTIVITAMIN CH
1.0000 | ORAL_TABLET | Freq: Every day | ORAL | Status: DC
  Administered 2021-04-13: 1 via ORAL
  Filled 2021-04-12: qty 1

## 2021-04-12 MED ORDER — CHLORHEXIDINE GLUCONATE CLOTH 2 % EX PADS: 6.0000 | MEDICATED_PAD | Freq: Every day | CUTANEOUS | Status: DC

## 2021-04-12 MED ORDER — LORATADINE 10 MG PO TABS
10.0000 mg | ORAL_TABLET | Freq: Every day | ORAL | Status: DC
  Administered 2021-04-13: 10 mg via ORAL
  Filled 2021-04-12: qty 1
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
## 2021-04-12 NOTE — Progress Notes (Signed)
[facility] ADULT ICU REPLACEMENT PROTOCOL   The patient does apply for the [facility] Adult ICU Electrolyte Replacment Protocol based on the criteria listed below:   1.Exclusion criteria: TCTS patients, ECMO patients and Hypothermia Protocol, and   Dialysis patients 2. Is GFR >/= 30 ml/min? Yes.    Patient's GFR today is >60 3. Is SCr </= 2? Yes.   Patient's SCr is .57 mg/dL 4. Did SCr increase >/= 0.5 in 24 hours? No. 5.Pt's weight >40kg  Yes.   6. Abnormal electrolyte(s): K 3.3, Mg 1.7  7. Electrolytes replaced per protocol   Ardelle Park 04/12/2021 5:11 AM

## 2021-04-12 NOTE — Progress Notes (Signed)
Report given to [employer], Pt transferred to [address] room 114 with all belongings.

## 2021-04-13 ENCOUNTER — Encounter (HOSPITAL_COMMUNITY): Payer: Self-pay | Admitting: Pulmonary Disease

## 2021-04-13 LAB — PROTEIN, URINE, 24 HOUR
Collection Interval-UPROT: 24 hours
Protein, Urine: <6 mg/dL
Urine Total Volume-UPROT: 3300 mL

## 2021-04-13 LAB — CREATININE, URINE, 24 HOUR
Collection Interval-UCRE24: 24 hours
Creatinine, 24H Ur: 1366 mg/d (ref 600–1800)
Creatinine, Urine: 41.39 mg/dL
Urine Total Volume-UCRE24: 3300 mL
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
## 2021-04-14 ENCOUNTER — Encounter: Payer: Self-pay | Admitting: Obstetrics and Gynecology

## 2021-04-14 ENCOUNTER — Ambulatory Visit: Payer: Medicaid Other | Attending: Obstetrics and Gynecology

## 2021-04-14 ENCOUNTER — Ambulatory Visit: Payer: Medicaid Other | Admitting: *Deleted

## 2021-04-16 ENCOUNTER — Encounter: Payer: Self-pay | Admitting: Obstetrics and Gynecology

## 2021-04-17 LAB — DRUG PROFILE, UR, 9 DRUGS (LABCORP)
Amphetamines, Urine: NEGATIVE ng/mL
Barbiturate, Ur: NEGATIVE ng/mL
Benzodiazepine Quant, Ur: NEGATIVE ng/mL
Cannabinoid Quant, Ur: POSITIVE ng/mL — AB
Cocaine (Metab.): NEGATIVE ng/mL
Methadone Screen, Urine: NEGATIVE ng/mL
Opiate Quant, Ur: NEGATIVE ng/mL
Phencyclidine, Ur: NEGATIVE ng/mL
Propoxyphene, Urine: NEGATIVE ng/mL

## 2021-04-23 ENCOUNTER — Encounter: Payer: Medicaid Other | Admitting: Obstetrics & Gynecology

## 2021-05-05 ENCOUNTER — Inpatient Hospital Stay (HOSPITAL_COMMUNITY)
Admission: AD | Admit: 2021-05-05 | Discharge: 2021-05-05 | Disposition: A | Discharge status: Home / Self Care | Payer: Medicaid Other | Attending: Obstetrics & Gynecology | Admitting: Obstetrics & Gynecology

## 2021-05-05 ENCOUNTER — Telehealth: Payer: Self-pay | Admitting: *Deleted

## 2021-05-05 ENCOUNTER — Encounter (HOSPITAL_COMMUNITY): Payer: Self-pay | Admitting: Obstetrics & Gynecology

## 2021-05-05 DIAGNOSIS — Z79899 Other long term (current) drug therapy: Secondary | ICD-10-CM | POA: Diagnosis not present

## 2021-05-05 DIAGNOSIS — O10013 Pre-existing essential hypertension complicating pregnancy, third trimester: Secondary | ICD-10-CM

## 2021-05-05 DIAGNOSIS — R519 Headache, unspecified: Secondary | ICD-10-CM | POA: Diagnosis not present

## 2021-05-05 DIAGNOSIS — Z7982 Long term (current) use of aspirin: Secondary | ICD-10-CM | POA: Insufficient documentation

## 2021-05-05 DIAGNOSIS — O163 Unspecified maternal hypertension, third trimester: Secondary | ICD-10-CM

## 2021-05-05 DIAGNOSIS — Z3A29 29 weeks gestation of pregnancy: Secondary | ICD-10-CM

## 2021-05-05 DIAGNOSIS — O10913 Unspecified pre-existing hypertension complicating pregnancy, third trimester: Secondary | ICD-10-CM | POA: Diagnosis not present

## 2021-05-05 DIAGNOSIS — R1013 Epigastric pain: Secondary | ICD-10-CM | POA: Diagnosis not present

## 2021-05-05 DIAGNOSIS — O09293 Supervision of pregnancy with other poor reproductive or obstetric history, third trimester: Secondary | ICD-10-CM | POA: Insufficient documentation

## 2021-05-05 DIAGNOSIS — R102 Pelvic and perineal pain: Secondary | ICD-10-CM | POA: Diagnosis not present

## 2021-05-05 DIAGNOSIS — O26893 Other specified pregnancy related conditions, third trimester: Secondary | ICD-10-CM | POA: Diagnosis present

## 2021-05-05 DIAGNOSIS — O10919 Unspecified pre-existing hypertension complicating pregnancy, unspecified trimester: Secondary | ICD-10-CM

## 2021-05-05 DIAGNOSIS — I10 Essential (primary) hypertension: Secondary | ICD-10-CM

## 2021-05-05 DIAGNOSIS — Z888 Allergy status to other drugs, medicaments and biological substances status: Secondary | ICD-10-CM | POA: Diagnosis not present

## 2021-05-05 LAB — COMPREHENSIVE METABOLIC PANEL
ALT: <5 U/L (ref 0–44)
AST: 15 U/L (ref 15–41)
Albumin: 2.8 g/dL — ABNORMAL LOW (ref 3.5–5.0)
Alkaline Phosphatase: 65 U/L (ref 38–126)
Anion gap: 8 (ref 5–15)
BUN: 6 mg/dL (ref 6–20)
CO2: 23 mmol/L (ref 22–32)
Calcium: 8.8 mg/dL — ABNORMAL LOW (ref 8.9–10.3)
Chloride: 104 mmol/L (ref 98–111)
Creatinine, Ser: 0.63 mg/dL (ref 0.44–1.00)
GFR, Estimated: >60 mL/min (ref >60)
Glucose, Bld: 105 mg/dL — ABNORMAL HIGH (ref 70–99)
Potassium: 3.3 mmol/L — ABNORMAL LOW (ref 3.5–5.1)
Sodium: 135 mmol/L (ref 135–145)
Total Bilirubin: 0.5 mg/dL (ref 0.3–1.2)
Total Protein: 5.9 g/dL — ABNORMAL LOW (ref 6.5–8.1)

## 2021-05-05 LAB — URINALYSIS, ROUTINE W REFLEX MICROSCOPIC
Bilirubin Urine: NEGATIVE
Glucose, UA: NEGATIVE mg/dL
Hgb urine dipstick: NEGATIVE
Ketones, ur: NEGATIVE mg/dL
Leukocytes,Ua: NEGATIVE
Nitrite: NEGATIVE
Protein, ur: NEGATIVE mg/dL
Specific Gravity, Urine: 1.017 (ref 1.005–1.030)
pH: 7 (ref 5.0–8.0)

## 2021-05-05 LAB — WET PREP, GENITAL
Clue Cells Wet Prep HPF POC: NONE SEEN
Sperm: NONE SEEN
Trich, Wet Prep: NONE SEEN
Yeast Wet Prep HPF POC: NONE SEEN

## 2021-05-05 LAB — AMNISURE RUPTURE OF MEMBRANE (ROM) NOT AT ARMC: Amnisure ROM: NEGATIVE

## 2021-05-05 LAB — CBC
HCT: 28.4 % — ABNORMAL LOW (ref 36.0–46.0)
Hemoglobin: 9.5 g/dL — ABNORMAL LOW (ref 12.0–15.0)
MCH: 30 pg (ref 26.0–34.0)
MCHC: 33.5 g/dL (ref 30.0–36.0)
MCV: 89.6 fL (ref 80.0–100.0)
Platelets: 340 10*3/uL (ref 150–400)
RBC: 3.17 MIL/uL — ABNORMAL LOW (ref 3.87–5.11)
RDW: 13.6 % (ref 11.5–15.5)
WBC: 12.1 10*3/uL — ABNORMAL HIGH (ref 4.0–10.5)
nRBC: 0 % (ref 0.0–0.2)

## 2021-05-05 LAB — PROTEIN / CREATININE RATIO, URINE
Creatinine, Urine: 164.85 mg/dL
Protein Creatinine Ratio: 0.12 mg/mg{Cre} (ref 0.00–0.15)
Total Protein, Urine: 20 mg/dL

## 2021-05-05 MED ORDER — NIFEDIPINE ER OSMOTIC RELEASE 30 MG PO TB24: 30.0000 mg | ORAL_TABLET | Freq: Two times a day (BID) | ORAL | 5 refills | Status: DC

## 2021-05-05 MED ORDER — ACETAMINOPHEN 500 MG PO TABS
1000.0000 mg | ORAL_TABLET | Freq: Once | ORAL | Status: AC
  Administered 2021-05-05: 1000 mg via ORAL
  Filled 2021-05-05: qty 2

## 2021-05-05 NOTE — Discharge Instructions (Signed)
Please take your Nifedipine XL 30 mg twice a day. Continue to monitor your blood pressure every day.

## 2021-05-05 NOTE — MAU Provider Note (Signed)
History     CSN: [id number]  Arrival date and time: 05/05/21 1856  Provider's First Contact with patient at     Chief Complaint  Patient presents with   Hypertension   Pelvic Pain   Ms. Tracy Black is a 35 y.o. year old G44P2022 female at 66w1dweeks gestation who presents to MAU reporting "several elevated BPs" today. Her BP was 144/105. She now reports seeing floaters for 1 week, has a H/A and epigastric pain. She has constant pelvic pain that increases with walking; rated 9/10. She has not taken any medication for the pain. She reports leaking clear fluid for 2 weeks. She denies VB. She reports good (+) FM. Her high risk pregnancy is complicated by cHTN on Procardia 30 mg daily. 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    Past Surgical History:  Procedure Laterality Date   WISDOM TOOTH EXTRACTION      Family History  Problem Relation Age of Onset   Cancer Maternal Grandmother     Social History   Tobacco Use   Smoking status: Unknown   Smokeless tobacco: Never  Vaping Use   Vaping Use: Never used  Substance Use Topics   Alcohol use: Not Currently   Drug use: Yes    Types: Marijuana    Comment: patient reports consumed two CBD products    Allergies:  Allergies  Allergen Reactions   Lisinopril Swelling    Lip swelling Lip swelling   Lisinopril Rash    Medications Prior to Admission  Medication Sig Dispense Refill Last Dose   aspirin EC 81 MG tablet Take 1 tablet (81 mg total) by mouth daily. Take after 12 weeks for prevention of preeclampsia later in pregnancy 300 tablet 2 05/05/2021    cetirizine (ZYRTEC) 10 MG tablet Take 1 tablet (10 mg total) by mouth daily. 30 tablet 5 05/05/2021   NIFEdipine (PROCARDIA XL) 30 MG 24 hr tablet Take 1 tablet (30 mg total) by mouth daily. 30 tablet 5 05/05/2021   Prenatal Vit-Fe Fumarate-FA (MULTIVITAMIN-PRENATAL) 27-0.8 MG TABS tablet Take 1 tablet by mouth daily after breakfast. 30 tablet 3 05/05/2021   aspirin 81 MG EC tablet Take 81 mg by mouth daily.      Blood Pressure Monitoring (BLOOD PRESSURE KIT) DEVI 1 each by Does not apply route once a week. 1 each 0    cetirizine (ZYRTEC) 10 MG tablet Take 10 mg by mouth daily.      dicloxacillin (DYNAPEN) 500 MG capsule Take 500 mg by mouth 4 (four) times daily. (Patient not taking: Reported on 01/29/2021)      NIFEdipine (PROCARDIA-XL/NIFEDICAL-XL) 30 MG 24 hr tablet Take 30 mg by mouth daily.      Prenatal Vit-Fe Fumarate-FA (MULTIVITAMIN-PRENATAL) 27-0.8 MG TABS tablet Take 1 tablet by mouth every morning.       Review of Systems  Constitutional: Negative.  HENT: Negative.    Eyes:  Positive for visual disturbance.  Gastrointestinal:        Epigastric pain  Genitourinary:  Positive for pelvic pain (increases with walking) and vaginal discharge (some itchiness and leaking clear fluid x 2 weeks).  Allergic/Immunologic: Negative.   Neurological:  Positive for headaches.  Hematological: Negative.   Psychiatric/Behavioral: Negative.    Physical Exam   Patient Vitals for the past 24 hrs:  BP Temp Temp src Pulse Resp SpO2 Height Weight  05/05/21 2100 (!) 139/95 -- -- 87 -- 100 % -- --  05/05/21 2045 (!) 149/89 -- -- 90 -- 99 % -- --  05/05/21 2030 (!) 144/95 -- -- 92 -- 99 % -- --  05/05/21 2015 (!) 135/97 -- -- 79 -- 100 % -- --  05/05/21 2000 (!) 141/89 -- -- 85 -- 98 % -- --  05/05/21 1950 (!) 145/91 -- -- 86 -- 99 % -- --  05/05/21 1945 -- -- -- -- -- 100 % -- --  05/05/21 1940 -- -- -- -- -- 98 % -- --  05/05/21 1919 (!) 144/95 98.5 F (36.9 C) Oral 89 19 99 % _0  (1.702 m)  86.5 kg     Physical Exam Vitals and nursing note reviewed. Exam conducted with a chaperone present.  Constitutional:      Appearance: Normal appearance. She is normal weight.  Cardiovascular:     Rate and Rhythm: Normal rate.  Pulmonary:     Effort: Pulmonary effort is normal.  Abdominal:     Palpations: Abdomen is soft.  Genitourinary:    General: Normal vulva.     Comments: Pelvic exam: External genitalia normal, SE: vaginal walls pink and well rugated, cervix is smooth, pink, no lesions, moderate amt of thick, creamy, white vaginal d/c -- WP, GC/CT done, cervix visually closed, Uterus is non-tender, SD, cervical exam deferred Musculoskeletal:        General: Normal range of motion.  Skin:    General: Skin is warm and dry.  Neurological:     Mental Status: She is alert and oriented to person, place, and time.  Psychiatric:        Mood and Affect: Mood normal.        Behavior: Behavior normal.        Thought Content: Thought content normal.        Judgment: Judgment normal.   REACTIVE NST - FHR: 135 bpm / moderate variability / accels present / decels absent / TOCO: irregular UC's with UI noted  MAU Course  Procedures  MDM CCUA CBC CMP P/C Ratio Serial BP's   *Consult with Dr. Elonda Husky @ [phone] - notified of patient's complaints, assessments, lab & NST results, tx plan increase Procardia XL from 30 mg daily to BID - ok to d/c home, agrees with plan   Results for orders placed or performed during the hospital encounter of 05/05/21 (from the past 24 hour(s))  Protein / creatinine ratio, urine     Status: None   Collection Time: 05/05/21  7:20 PM  Result Value Ref Range   Creatinine, Urine 164.85 mg/dL   Total Protein, Urine 20 mg/dL   Protein Creatinine Ratio 0.12 0.00 - 0.15 mg/mg[Cre]  Urinalysis, Routine w reflex microscopic     Status: Abnormal   Collection Time: 05/05/21  7:20 PM  Result Value Ref Range   Color, Urine YELLOW YELLOW   APPearance HAZY (A) CLEAR    Specific Gravity, Urine 1.017 1.005 -  1.030   pH 7.0 5.0 - 8.0   Glucose, UA NEGATIVE NEGATIVE mg/dL   Hgb urine dipstick NEGATIVE NEGATIVE   Bilirubin Urine NEGATIVE NEGATIVE   Ketones, ur NEGATIVE NEGATIVE mg/dL   Protein, ur NEGATIVE NEGATIVE mg/dL   Nitrite NEGATIVE NEGATIVE   Leukocytes,Ua NEGATIVE NEGATIVE  CBC     Status: Abnormal   Collection Time: 05/05/21  7:42 PM  Result Value Ref Range   WBC 12.1 (H) 4.0 - 10.5 K/uL   RBC 3.17 (L) 3.87 - 5.11 MIL/uL   Hemoglobin 9.5 (L) 12.0 - 15.0 g/dL   HCT 28.4 (L) 36.0 - 46.0 %   MCV 89.6 80.0 - 100.0 fL   MCH 30.0 26.0 - 34.0 pg   MCHC 33.5 30.0 - 36.0 g/dL   RDW 13.6 11.5 - 15.5 %   Platelets 340 150 - 400 K/uL   nRBC 0.0 0.0 - 0.2 %  Comprehensive metabolic panel     Status: Abnormal   Collection Time: 05/05/21  7:42 PM  Result Value Ref Range   Sodium 135 135 - 145 mmol/L   Potassium 3.3 (L) 3.5 - 5.1 mmol/L   Chloride 104 98 - 111 mmol/L   CO2 23 22 - 32 mmol/L   Glucose, Bld 105 (H) 70 - 99 mg/dL   BUN 6 6 - 20 mg/dL   Creatinine, Ser 0.63 0.44 - 1.00 mg/dL   Calcium 8.8 (L) 8.9 - 10.3 mg/dL   Total Protein 5.9 (L) 6.5 - 8.1 g/dL   Albumin 2.8 (L) 3.5 - 5.0 g/dL   AST 15 15 - 41 U/L   ALT <5 0 - 44 U/L   Alkaline Phosphatase 65 38 - 126 U/L   Total Bilirubin 0.5 0.3 - 1.2 mg/dL   GFR, Estimated >60 >60 mL/min   Anion gap 8 5 - 15  Amnisure rupture of membrane (rom)not at [facility]     Status: None   Collection Time: 05/05/21  8:07 PM  Result Value Ref Range   Amnisure ROM NEGATIVE   Wet prep, genital     Status: Abnormal   Collection Time: 05/05/21  8:19 PM  Result Value Ref Range   Yeast Wet Prep HPF POC NONE SEEN NONE SEEN   Trich, Wet Prep NONE SEEN NONE SEEN   Clue Cells Wet Prep HPF POC NONE SEEN NONE SEEN   WBC, Wet Prep HPF POC MANY (A) NONE SEEN   Sperm NONE SEEN       Assessment and Plan  Chronic hypertension during pregnancy, antepartum  - PEC labs normal - Rx sent to change Procardia XL from 30 mg  daily to 30 mg BID - Information provided on HTN in pregnancy   [redacted] weeks gestation of pregnancy  - Discharge home - Information provided on RLP - Patient verbalized an understanding of the plan of care and agrees.      Laury Deep, CNM 05/05/2021, 8:33 PM

## 2021-05-05 NOTE — Telephone Encounter (Signed)
Received a voicemail from [employer] BP alert 144/105. States patient co abnormal vision, abdominal pain, SOB.  I called patient and she reports she is seeing black dots. She denies headaches or contractions. She c/o back/ hip/ abd pain similar to round ligament pain. I advised her to go to [facility] for evaluation asap. She states she is waiting for her ride and will go to hospital within the hour. I informed her if she feels he symptoms worsen to call 911. She voices understanding. Lenorris Karger,RN

## 2021-05-05 NOTE — MAU Note (Signed)
..  Tracy Black is a 35 y.o. at [redacted]w[redacted]d here in MAU reporting: blood pressure this morning was 144/105, began seeing floaters a week ago, denies headache or epigastric pain.  Reports pelvic pain that is constant and worse when she tries to walk.  Reports clear leaking of fluid that began two weeks ago.  Denies vaginal bleeding. +FM. Pain score: 9/10 Vitals:   05/05/21 1919  BP: (!) 144/95  Pulse: 89  Resp: 19  Temp: 98.5 F (36.9 C)  SpO2: 99%     FHT:144 Lab orders placed from triage: UA

## 2021-05-05 NOTE — MAU Note (Signed)
Patient reports HA with a pain rating of 4/10, that she has been having for two days, and has not taken any medication for it. Also, reports right sided epigastric pain that is sharp but not constant with a pain rating of 7/10. Patient reports fainting episodes three times, last one was 4 days ago.

## 2021-05-07 ENCOUNTER — Other Ambulatory Visit: Payer: Medicaid Other

## 2021-05-07 ENCOUNTER — Other Ambulatory Visit: Payer: Self-pay

## 2021-05-07 DIAGNOSIS — O0990 Supervision of high risk pregnancy, unspecified, unspecified trimester: Secondary | ICD-10-CM

## 2021-05-14 ENCOUNTER — Encounter: Payer: Medicaid Other | Admitting: Family Medicine

## 2021-05-21 ENCOUNTER — Encounter: Payer: Medicaid Other | Admitting: Obstetrics and Gynecology

## 2021-05-29 ENCOUNTER — Encounter (HOSPITAL_COMMUNITY): Payer: Self-pay | Admitting: Obstetrics and Gynecology

## 2021-05-29 ENCOUNTER — Inpatient Hospital Stay (HOSPITAL_COMMUNITY)
Admission: AD | Admit: 2021-05-29 | Discharge: 2021-05-29 | Disposition: A | Discharge status: Home / Self Care | Payer: Medicaid Other | Attending: Obstetrics and Gynecology | Admitting: Obstetrics and Gynecology

## 2021-05-29 ENCOUNTER — Other Ambulatory Visit: Payer: Self-pay

## 2021-05-29 DIAGNOSIS — Z0371 Encounter for suspected problem with amniotic cavity and membrane ruled out: Secondary | ICD-10-CM

## 2021-05-29 DIAGNOSIS — O10913 Unspecified pre-existing hypertension complicating pregnancy, third trimester: Secondary | ICD-10-CM

## 2021-05-29 DIAGNOSIS — Z3A34 34 weeks gestation of pregnancy: Secondary | ICD-10-CM

## 2021-05-29 DIAGNOSIS — O10013 Pre-existing essential hypertension complicating pregnancy, third trimester: Secondary | ICD-10-CM

## 2021-05-29 DIAGNOSIS — Z79899 Other long term (current) drug therapy: Secondary | ICD-10-CM | POA: Insufficient documentation

## 2021-05-29 DIAGNOSIS — Z3A32 32 weeks gestation of pregnancy: Secondary | ICD-10-CM | POA: Insufficient documentation

## 2021-05-29 LAB — POCT FERN TEST: POCT Fern Test: NEGATIVE

## 2021-05-29 MED ORDER — LABETALOL HCL 200 MG PO TABS: 200.0000 mg | ORAL_TABLET | Freq: Two times a day (BID) | ORAL | 0 refills | Status: DC

## 2021-05-29 NOTE — MAU Note (Signed)
Urine is in lab 

## 2021-05-29 NOTE — MAU Provider Note (Signed)
History     CSN: [id number]  Arrival date and time: 05/29/21 1324   Event Date/Time   First Provider Initiated Contact with Patient 05/29/21 1340      Chief Complaint  Patient presents with   Rupture of Membranes   HPI Tracy Black is a 35 y.o. [id number] at 15w4dwho presents with vaginal discharge. Was walking around today & noticed her underwear was wet. Couldn't tell what it looked like but has continued to feel like she's leaking. Denies abdominal pain, dysuria, or vaginal bleeding. Reports good fetal movement.  Has chronic hypertension & has not missed any doses of her procardia. Denies headache, visual disturbance, or epigastric pain.   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    Past Surgical History:  Procedure Laterality Date   WISDOM TOOTH EXTRACTION      Family History  Problem Relation Age of Onset   Cancer Maternal Grandmother     Social History   Tobacco Use   Smoking status: Unknown   Smokeless tobacco: Never  Vaping Use   Vaping Use: Never used  Substance Use Topics   Alcohol use: Not Currently   Drug use: Yes    Types: Marijuana    Comment: patient reports consumed two CBD products    Allergies:  Allergies  Allergen Reactions   Lisinopril Swelling    Lip swelling Lip swelling   Lisinopril Rash    Medications Prior to Admission  Medication Sig Dispense Refill Last Dose   aspirin EC 81 MG tablet Take 1 tablet (81 mg total) by mouth daily. Take after 12 weeks for prevention of preeclampsia later in pregnancy 300 tablet 2 05/28/2021   cetirizine (ZYRTEC) 10 MG tablet Take 1 tablet (10 mg total) by mouth daily. 30  tablet 5 05/29/2021   NIFEdipine (PROCARDIA XL) 30 MG 24 hr tablet Take 1 tablet (30 mg total) by mouth 2 (two) times daily. 30 tablet 5 05/29/2021   Prenatal Vit-Fe Fumarate-FA (MULTIVITAMIN-PRENATAL) 27-0.8 MG TABS tablet Take 1 tablet by mouth daily after breakfast. 30 tablet 3 Past Week   aspirin 81 MG EC tablet Take 81 mg by mouth daily.      Blood Pressure Monitoring (BLOOD PRESSURE KIT) DEVI 1 each by Does not apply route once a week. 1 each 0    cetirizine (ZYRTEC) 10 MG tablet Take 10 mg by mouth daily.      dicloxacillin (DYNAPEN) 500 MG capsule Take 500 mg by mouth 4 (four) times daily. (Patient not taking: Reported on 01/29/2021)       Review of Systems  Constitutional: Negative.   Eyes:  Negative for visual disturbance.  Gastrointestinal: Negative.   Genitourinary:  Positive for vaginal discharge. Negative for dysuria and vaginal bleeding.  Neurological:  Negative for headaches.  Physical Exam   Blood pressure (!) 140/104, pulse 91, temperature 98.6 F (37 C), resp. rate 17, last menstrual period 10/13/2020, SpO2 100 %,  unknown if currently breastfeeding.  Physical Exam Vitals and nursing note reviewed. Exam conducted with a chaperone present.  Constitutional:      Appearance: Normal appearance. She is not ill-appearing.  Eyes:     General: No scleral icterus. Pulmonary:     Effort: Pulmonary effort is normal. No respiratory distress.  Genitourinary:    Comments: Sterile Spec exam: NEFG, thick white discharge, no pooling of fluid, no blood. Cervix visually closed.  Skin:    General: Skin is warm and dry.  Neurological:     General: No focal deficit present.     Mental Status: She is alert.  Psychiatric:        Mood and Affect: Mood normal.        Behavior: Behavior normal.   NST:  Baseline: 140 bpm, Variability: Good {> 6 bpm), Accelerations: Reactive, and Decelerations: Absent  MAU Course  Procedures Results for orders placed or performed during the hospital  encounter of 05/29/21 (from the past 24 hour(s))  Fern Test     Status: Normal   Collection Time: 05/29/21  2:02 PM  Result Value Ref Range   POCT Fern Test Negative = intact amniotic membranes     MDM SSE performed - no pooling of fluid. Fern negative.  Reactive NST  Elevated BPs. Known CHTN. No signs of pre eclampsia. Currently taking procardia xl 30 BID. Reviewed with Dr. Dione Plover who recommends adding labetalol 200 BID.   Assessment and Plan   1. Encounter for suspected PROM, with rupture of membranes not found  -reviewed PTL precautions & reasons to return to MAU  2. Chronic hypertension in obstetric context in third trimester  -Rx labetalol 200 mg BID to take in addition to her procardia  3. [redacted] weeks gestation of pregnancy      Jorje Guild 05/29/2021, 2:45 PM

## 2021-05-29 NOTE — MAU Note (Signed)
Pt reports that she was walking at 1150 when she felt a gush of fluid.   Denies vaginal bleeding.   Reports +FM

## 2021-06-06 ENCOUNTER — Encounter: Payer: Medicaid Other | Admitting: Obstetrics & Gynecology

## 2021-06-17 ENCOUNTER — Ambulatory Visit: Payer: No Typology Code available for payment source

## 2021-06-17 ENCOUNTER — Ambulatory Visit: Payer: Self-pay

## 2021-06-24 ENCOUNTER — Other Ambulatory Visit: Payer: Self-pay

## 2021-06-24 ENCOUNTER — Inpatient Hospital Stay (HOSPITAL_COMMUNITY)
Admission: AD | Admit: 2021-06-24 | Discharge: 2021-06-24 | Disposition: A | Discharge status: Home / Self Care | Payer: Medicaid Other | Attending: Family Medicine | Admitting: Family Medicine

## 2021-06-24 ENCOUNTER — Other Ambulatory Visit: Payer: Self-pay | Admitting: Obstetrics and Gynecology

## 2021-06-24 ENCOUNTER — Ambulatory Visit: Payer: Medicaid Other | Attending: Obstetrics and Gynecology | Admitting: *Deleted

## 2021-06-24 ENCOUNTER — Other Ambulatory Visit: Payer: Self-pay | Admitting: Advanced Practice Midwife

## 2021-06-24 ENCOUNTER — Telehealth: Payer: Self-pay

## 2021-06-24 ENCOUNTER — Ambulatory Visit (HOSPITAL_BASED_OUTPATIENT_CLINIC_OR_DEPARTMENT_OTHER): Payer: Medicaid Other

## 2021-06-24 ENCOUNTER — Encounter (HOSPITAL_COMMUNITY): Payer: Self-pay | Admitting: Family Medicine

## 2021-06-24 ENCOUNTER — Encounter: Payer: Self-pay | Admitting: *Deleted

## 2021-06-24 VITALS — BP 140/80 | HR 98

## 2021-06-24 DIAGNOSIS — O26893 Other specified pregnancy related conditions, third trimester: Secondary | ICD-10-CM | POA: Diagnosis not present

## 2021-06-24 DIAGNOSIS — O10013 Pre-existing essential hypertension complicating pregnancy, third trimester: Secondary | ICD-10-CM

## 2021-06-24 DIAGNOSIS — R519 Headache, unspecified: Secondary | ICD-10-CM | POA: Diagnosis not present

## 2021-06-24 DIAGNOSIS — O99323 Drug use complicating pregnancy, third trimester: Secondary | ICD-10-CM | POA: Diagnosis not present

## 2021-06-24 DIAGNOSIS — Z79899 Other long term (current) drug therapy: Secondary | ICD-10-CM | POA: Diagnosis not present

## 2021-06-24 DIAGNOSIS — O0933 Supervision of pregnancy with insufficient antenatal care, third trimester: Secondary | ICD-10-CM | POA: Insufficient documentation

## 2021-06-24 DIAGNOSIS — Z3A36 36 weeks gestation of pregnancy: Secondary | ICD-10-CM | POA: Diagnosis not present

## 2021-06-24 DIAGNOSIS — O10912 Unspecified pre-existing hypertension complicating pregnancy, second trimester: Secondary | ICD-10-CM

## 2021-06-24 DIAGNOSIS — Z148 Genetic carrier of other disease: Secondary | ICD-10-CM | POA: Diagnosis not present

## 2021-06-24 DIAGNOSIS — O0990 Supervision of high risk pregnancy, unspecified, unspecified trimester: Secondary | ICD-10-CM

## 2021-06-24 DIAGNOSIS — F1191 Opioid use, unspecified, in remission: Secondary | ICD-10-CM | POA: Diagnosis not present

## 2021-06-24 DIAGNOSIS — O10919 Unspecified pre-existing hypertension complicating pregnancy, unspecified trimester: Secondary | ICD-10-CM

## 2021-06-24 DIAGNOSIS — Z369 Encounter for antenatal screening, unspecified: Secondary | ICD-10-CM | POA: Insufficient documentation

## 2021-06-24 DIAGNOSIS — O10913 Unspecified pre-existing hypertension complicating pregnancy, third trimester: Secondary | ICD-10-CM | POA: Insufficient documentation

## 2021-06-24 DIAGNOSIS — O358XX0 Maternal care for other (suspected) fetal abnormality and damage, not applicable or unspecified: Secondary | ICD-10-CM | POA: Diagnosis not present

## 2021-06-24 DIAGNOSIS — H43399 Other vitreous opacities, unspecified eye: Secondary | ICD-10-CM

## 2021-06-24 LAB — CBC
HCT: 29.2 % — ABNORMAL LOW (ref 36.0–46.0)
Hemoglobin: 9.5 g/dL — ABNORMAL LOW (ref 12.0–15.0)
MCH: 28 pg (ref 26.0–34.0)
MCHC: 32.5 g/dL (ref 30.0–36.0)
MCV: 86.1 fL (ref 80.0–100.0)
Platelets: 356 10*3/uL (ref 150–400)
RBC: 3.39 MIL/uL — ABNORMAL LOW (ref 3.87–5.11)
RDW: 14 % (ref 11.5–15.5)
WBC: 7.9 10*3/uL (ref 4.0–10.5)
nRBC: 0 % (ref 0.0–0.2)

## 2021-06-24 LAB — COMPREHENSIVE METABOLIC PANEL
ALT: 5 U/L (ref 0–44)
AST: 15 U/L (ref 15–41)
Albumin: 2.7 g/dL — ABNORMAL LOW (ref 3.5–5.0)
Alkaline Phosphatase: 136 U/L — ABNORMAL HIGH (ref 38–126)
Anion gap: 9 (ref 5–15)
BUN: <5 mg/dL — ABNORMAL LOW (ref 6–20)
CO2: 21 mmol/L — ABNORMAL LOW (ref 22–32)
Calcium: 8.9 mg/dL (ref 8.9–10.3)
Chloride: 109 mmol/L (ref 98–111)
Creatinine, Ser: 0.66 mg/dL (ref 0.44–1.00)
GFR, Estimated: >60 mL/min (ref >60)
Glucose, Bld: 109 mg/dL — ABNORMAL HIGH (ref 70–99)
Potassium: 3.7 mmol/L (ref 3.5–5.1)
Sodium: 139 mmol/L (ref 135–145)
Total Bilirubin: 0.9 mg/dL (ref 0.3–1.2)
Total Protein: 6 g/dL — ABNORMAL LOW (ref 6.5–8.1)

## 2021-06-24 LAB — PROTEIN / CREATININE RATIO, URINE
Creatinine, Urine: 133.4 mg/dL
Protein Creatinine Ratio: 0.16 mg/mg{Cre} — ABNORMAL HIGH (ref 0.00–0.15)
Total Protein, Urine: 22 mg/dL

## 2021-06-24 MED ORDER — ACETAMINOPHEN 500 MG PO TABS
1000.0000 mg | ORAL_TABLET | Freq: Once | ORAL | Status: AC
  Administered 2021-06-24: 1000 mg via ORAL
  Filled 2021-06-24: qty 2

## 2021-06-24 NOTE — Discharge Instructions (Addendum)
Someone from [facility] should call you or send notification through MyChart that you have been scheduled for a blood pressure check on Thursday 06/26/21. Please call if you have not heard from someone by 1 pm.

## 2021-06-24 NOTE — MAU Provider Note (Signed)
History     CSN: [id number]  Arrival date and time: 06/24/21 1455   Event Date/Time   First Provider Initiated Contact with Patient 06/24/21 1611      Chief Complaint  Patient presents with   Hypertension   Headache   Tracy Black is a 35 y.o. year old G69P2022 female at 79w2dweeks gestation who was sent to MAU by MFM for elevated BPS, seeing floaters and H/A. She has cHTN, but the H/A and floaters are new sx's. She has not taken any medication for her H/A. She denies any VB or LOF. She reports good (+) FM. She has had limited PNC with [facility].   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    Past Surgical History:  Procedure Laterality Date   WISDOM TOOTH EXTRACTION      Family History  Problem Relation Age of Onset   Cancer Maternal Grandmother     Social History   Tobacco Use   Smoking status: Unknown   Smokeless tobacco: Never  Vaping Use   Vaping Use: Never used  Substance Use Topics   Alcohol use: Not Currently   Drug use: Yes    Types: Marijuana    Comment: patient reports consumed two CBD products    Allergies:  Allergies  Allergen Reactions   Lisinopril Swelling    Lip swelling Lip swelling   Lisinopril Rash    Medications Prior to Admission  Medication Sig Dispense Refill Last Dose   aspirin EC 81 MG tablet Take 1 tablet (81 mg total) by mouth daily. Take after 12 weeks for prevention of preeclampsia later in pregnancy 300 tablet 2 06/24/2021   cetirizine (ZYRTEC) 10 MG tablet Take 1 tablet (10 mg total) by mouth daily. 30 tablet 5 06/24/2021   labetalol (NORMODYNE) 200 MG tablet Take 1 tablet (200 mg total) by  mouth 2 (two) times daily. 60 tablet 0 06/24/2021   NIFEdipine (PROCARDIA XL) 30 MG 24 hr tablet Take 1 tablet (30 mg total) by mouth 2 (two) times daily. 30 tablet 5 06/24/2021   Prenatal Vit-Fe Fumarate-FA (MULTIVITAMIN-PRENATAL) 27-0.8 MG TABS tablet Take 1 tablet by mouth daily after breakfast. 30 tablet 3 06/24/2021   Blood Pressure Monitoring (BLOOD PRESSURE KIT) DEVI 1 each by Does not apply route once a week. (Patient not taking: Reported on 06/24/2021) 1 each 0     Review of Systems  Constitutional: Negative.   HENT: Negative.    Eyes:  Positive for visual disturbance.  Respiratory: Negative.    Cardiovascular: Negative.   Gastrointestinal: Negative.   Endocrine: Negative.   Genitourinary: Negative.   Musculoskeletal: Negative.   Skin: Negative.   Allergic/Immunologic: Negative.   Neurological:  Positive for headaches.  Hematological: Negative.   Psychiatric/Behavioral: Negative.    Physical Exam   Patient Vitals for the past 24 hrs:  BP Temp Temp src Pulse Resp SpO2  06/24/21 1630 (!) 139/93 -- --  86 -- 100 %  06/24/21 1615 (!) 135/95 -- -- 85 -- 100 %  06/24/21 1604 (!) 141/95 -- -- 88 -- --  06/24/21 1544 138/88 98.3 F (36.8 C) Oral 88 14 98 %   Physical Exam Vitals and nursing note reviewed.  Constitutional:      Appearance: Normal appearance. She is normal weight.  HENT:     Head: Normocephalic and atraumatic.  Eyes:     Extraocular Movements: Extraocular movements intact.     Conjunctiva/sclera: Conjunctivae normal.     Pupils: Pupils are equal, round, and reactive to light.  Cardiovascular:     Rate and Rhythm: Normal rate.  Pulmonary:     Effort: Pulmonary effort is normal.  Abdominal:     Palpations: Abdomen is soft.  Genitourinary:    Comments: deferred Musculoskeletal:        General: Normal range of motion.  Skin:    General: Skin is warm and dry.  Neurological:     Mental Status: She is alert and oriented to person, place, and time.   Psychiatric:        Mood and Affect: Mood normal.        Behavior: Behavior normal.        Thought Content: Thought content normal.        Judgment: Judgment normal.   REACTIVE NST - FHR: 135 bpm / moderate variability / accels present / decels absent / TOCO: UI noted  MAU Course  Procedures  MDM CCUA CBC CMP P/C Ratio Serial BP's  GBS -- Results pending  Tylenol 1000 mg po  *Consult with Dr. Elly Modena @ [phone] - notified of patient's complaints, assessments, lab, NST results, H/A still there, but less now (3/10) -- will treat with Tylenol prior to d/c home -- recommended tx plan d/c home with BP check on Thursday 06/26/21   Results for orders placed or performed during the hospital encounter of 06/24/21 (from the past 24 hour(s))  CBC     Status: Abnormal   Collection Time: 06/24/21  3:18 PM  Result Value Ref Range   WBC 7.9 4.0 - 10.5 K/uL   RBC 3.39 (L) 3.87 - 5.11 MIL/uL   Hemoglobin 9.5 (L) 12.0 - 15.0 g/dL   HCT 29.2 (L) 36.0 - 46.0 %   MCV 86.1 80.0 - 100.0 fL   MCH 28.0 26.0 - 34.0 pg   MCHC 32.5 30.0 - 36.0 g/dL   RDW 14.0 11.5 - 15.5 %   Platelets 356 150 - 400 K/uL   nRBC 0.0 0.0 - 0.2 %  Comprehensive metabolic panel     Status: Abnormal   Collection Time: 06/24/21  3:18 PM  Result Value Ref Range   Sodium 139 135 - 145 mmol/L   Potassium 3.7 3.5 - 5.1 mmol/L   Chloride 109 98 - 111 mmol/L   CO2 21 (L) 22 - 32 mmol/L   Glucose, Bld 109 (H) 70 - 99 mg/dL   BUN <5 (L) 6 - 20 mg/dL   Creatinine, Ser 0.66 0.44 - 1.00 mg/dL   Calcium 8.9 8.9 - 10.3 mg/dL   Total Protein 6.0 (L) 6.5 - 8.1 g/dL   Albumin 2.7 (L) 3.5 - 5.0 g/dL   AST 15 15 - 41 U/L   ALT 5 0 - 44 U/L   Alkaline Phosphatase 136 (H) 38 - 126 U/L   Total Bilirubin 0.9 0.3 - 1.2 mg/dL   GFR, Estimated >60 >60 mL/min   Anion gap 9  5 - 15     *Dr. Donalee Citrin recommends coordination of patient's IOL @ 37 wks since she has new sx's, if labs WNL.  Assessment and Plan  Chronic hypertension affecting  pregnancy  - Continue Labetalol and Procardia as prescribed  Pregnancy headache in third trimester - Ok to take Tylenol at home  Floaters, unspecified laterality - Improved since being in MAU  [redacted] weeks gestation of pregnancy  - GBS done  - Discharge patient - [facility] should call you or send notification through MyChart that you have been scheduled for a blood pressure check on Thursday 06/26/21  - Patient verbalized an understanding of the plan of care and agrees.   Laury Deep, CNM 06/24/2021, 4:17 PM

## 2021-06-24 NOTE — Telephone Encounter (Signed)
Called patient to see if she could come in at 10:45am for the 11am slot.   Left a message for patient to call back.

## 2021-06-24 NOTE — Progress Notes (Signed)
Pt has had h/a, blurred vision, and floaters for the past two weeks.

## 2021-06-24 NOTE — MAU Note (Signed)
.  Tracy Black is a 35 y.o. at [redacted]w[redacted]d here in MAU reporting: sent from MFM for HTN, headache and seeing floaters. Has not taken anything for her headache. Last took BP meds this morning around 0800. Denies VB or LOF. Endorses good fetal movement.   Pain score: 6

## 2021-06-26 ENCOUNTER — Other Ambulatory Visit: Payer: Self-pay

## 2021-06-26 ENCOUNTER — Ambulatory Visit: Payer: Medicaid Other

## 2021-06-26 ENCOUNTER — Ambulatory Visit (INDEPENDENT_AMBULATORY_CARE_PROVIDER_SITE_OTHER): Payer: Medicaid Other | Admitting: General Practice

## 2021-06-26 VITALS — BP 154/96 | HR 86 | Ht 61.0 in | Wt 189.0 lb

## 2021-06-26 DIAGNOSIS — Z0130 Encounter for examination of blood pressure without abnormal findings: Secondary | ICD-10-CM

## 2021-06-26 LAB — OB RESULTS CONSOLE GBS: GBS: NEGATIVE

## 2021-06-26 LAB — CULTURE, BETA STREP (GROUP B ONLY)

## 2021-06-26 NOTE — Progress Notes (Signed)
Patient presents to office today for BP check following recent MAU visit on 12/6. She reports concern over opoid overdose history listed in her chart. Discussed with patient at length and reviewed notes from September hospital admission. Advised patient speak with a physician during her hospital admission. Patient denies headaches, dizziness, or blurry vision. Reports she is taking labetalol & procardia once a day not BID- she is unaware the recommendation was BID. BP 152/99 and 154/96. Patient is scheduled for IOL 12/11. Discussed with Dr Alvester Morin who advises patient take her medications BID and follow up for IOL on 12/11. Discussed with patient. Patient verbalized understanding.   Chase Caller RN BSN 06/26/21

## 2021-06-29 ENCOUNTER — Inpatient Hospital Stay (HOSPITAL_COMMUNITY)
Admission: AD | Admit: 2021-06-29 | Discharge: 2021-07-01 | DRG: 806 | Disposition: A | Discharge status: Home / Self Care | Payer: Medicaid Other | Attending: Obstetrics and Gynecology | Admitting: Obstetrics and Gynecology

## 2021-06-29 ENCOUNTER — Encounter (HOSPITAL_COMMUNITY): Payer: Self-pay | Admitting: Obstetrics and Gynecology

## 2021-06-29 ENCOUNTER — Inpatient Hospital Stay (HOSPITAL_COMMUNITY): Payer: Medicaid Other

## 2021-06-29 ENCOUNTER — Other Ambulatory Visit: Payer: Self-pay

## 2021-06-29 DIAGNOSIS — O1002 Pre-existing essential hypertension complicating childbirth: Principal | ICD-10-CM | POA: Diagnosis present

## 2021-06-29 DIAGNOSIS — O9902 Anemia complicating childbirth: Secondary | ICD-10-CM | POA: Diagnosis present

## 2021-06-29 DIAGNOSIS — O99324 Drug use complicating childbirth: Secondary | ICD-10-CM | POA: Diagnosis present

## 2021-06-29 DIAGNOSIS — O10919 Unspecified pre-existing hypertension complicating pregnancy, unspecified trimester: Secondary | ICD-10-CM | POA: Diagnosis present

## 2021-06-29 DIAGNOSIS — D62 Acute posthemorrhagic anemia: Secondary | ICD-10-CM | POA: Diagnosis not present

## 2021-06-29 DIAGNOSIS — Z3A37 37 weeks gestation of pregnancy: Secondary | ICD-10-CM

## 2021-06-29 DIAGNOSIS — I10 Essential (primary) hypertension: Secondary | ICD-10-CM | POA: Diagnosis present

## 2021-06-29 DIAGNOSIS — Z20822 Contact with and (suspected) exposure to covid-19: Secondary | ICD-10-CM | POA: Diagnosis present

## 2021-06-29 DIAGNOSIS — F1290 Cannabis use, unspecified, uncomplicated: Secondary | ICD-10-CM | POA: Diagnosis present

## 2021-06-29 DIAGNOSIS — D509 Iron deficiency anemia, unspecified: Secondary | ICD-10-CM | POA: Diagnosis present

## 2021-06-29 DIAGNOSIS — O0990 Supervision of high risk pregnancy, unspecified, unspecified trimester: Secondary | ICD-10-CM

## 2021-06-29 DIAGNOSIS — O285 Abnormal chromosomal and genetic finding on antenatal screening of mother: Secondary | ICD-10-CM | POA: Diagnosis present

## 2021-06-29 DIAGNOSIS — Z23 Encounter for immunization: Secondary | ICD-10-CM

## 2021-06-29 LAB — COMPREHENSIVE METABOLIC PANEL
ALT: <5 U/L (ref 0–44)
AST: 19 U/L (ref 15–41)
Albumin: 2.7 g/dL — ABNORMAL LOW (ref 3.5–5.0)
Alkaline Phosphatase: 131 U/L — ABNORMAL HIGH (ref 38–126)
Anion gap: 6 (ref 5–15)
BUN: <5 mg/dL — ABNORMAL LOW (ref 6–20)
CO2: 23 mmol/L (ref 22–32)
Calcium: 8.6 mg/dL — ABNORMAL LOW (ref 8.9–10.3)
Chloride: 106 mmol/L (ref 98–111)
Creatinine, Ser: 0.51 mg/dL (ref 0.44–1.00)
GFR, Estimated: >60 mL/min (ref >60)
Glucose, Bld: 77 mg/dL (ref 70–99)
Potassium: 3.9 mmol/L (ref 3.5–5.1)
Sodium: 135 mmol/L (ref 135–145)
Total Bilirubin: <0.1 mg/dL — ABNORMAL LOW (ref 0.3–1.2)
Total Protein: 5.5 g/dL — ABNORMAL LOW (ref 6.5–8.1)

## 2021-06-29 LAB — CBC
HCT: 28.3 % — ABNORMAL LOW (ref 36.0–46.0)
Hemoglobin: 9.1 g/dL — ABNORMAL LOW (ref 12.0–15.0)
MCH: 28.1 pg (ref 26.0–34.0)
MCHC: 32.2 g/dL (ref 30.0–36.0)
MCV: 87.3 fL (ref 80.0–100.0)
Platelets: 336 10*3/uL (ref 150–400)
RBC: 3.24 MIL/uL — ABNORMAL LOW (ref 3.87–5.11)
RDW: 14.2 % (ref 11.5–15.5)
WBC: 10.2 10*3/uL (ref 4.0–10.5)
nRBC: 0 % (ref 0.0–0.2)

## 2021-06-29 LAB — RESP PANEL BY RT-PCR (FLU A&B, COVID) ARPGX2
Influenza A by PCR: NEGATIVE
Influenza B by PCR: NEGATIVE
SARS Coronavirus 2 by RT PCR: NEGATIVE

## 2021-06-29 LAB — TYPE AND SCREEN
ABO/RH(D): O POS
Antibody Screen: NEGATIVE

## 2021-06-29 LAB — PROTEIN / CREATININE RATIO, URINE
Creatinine, Urine: 95.01 mg/dL
Protein Creatinine Ratio: 0.21 mg/mg{Cre} — ABNORMAL HIGH (ref 0.00–0.15)
Total Protein, Urine: 20 mg/dL

## 2021-06-29 LAB — RPR: RPR Ser Ql: NONREACTIVE

## 2021-06-29 MED ORDER — LABETALOL HCL 200 MG PO TABS
200.0000 mg | ORAL_TABLET | Freq: Two times a day (BID) | ORAL | Status: DC
  Administered 2021-06-29 (×2): 200 mg via ORAL
  Filled 2021-06-29 (×2): qty 1

## 2021-06-29 MED ORDER — OXYTOCIN BOLUS FROM INFUSION
333.0000 mL | Freq: Once | INTRAVENOUS | Status: AC
  Administered 2021-06-30: 333 mL via INTRAVENOUS

## 2021-06-29 MED ORDER — OXYCODONE-ACETAMINOPHEN 5-325 MG PO TABS: 2.0000 | ORAL_TABLET | ORAL | Status: DC | PRN

## 2021-06-29 MED ORDER — OXYCODONE-ACETAMINOPHEN 5-325 MG PO TABS: 1.0000 | ORAL_TABLET | ORAL | Status: DC | PRN

## 2021-06-29 MED ORDER — LIDOCAINE HCL (PF) 1 % IJ SOLN
30.0000 mL | INTRAMUSCULAR | Status: AC | PRN
  Administered 2021-06-30: 30 mL via SUBCUTANEOUS
  Filled 2021-06-29: qty 30

## 2021-06-29 MED ORDER — MISOPROSTOL 50MCG HALF TABLET
50.0000 ug | ORAL_TABLET | ORAL | Status: DC
  Administered 2021-06-29: 50 ug via ORAL

## 2021-06-29 MED ORDER — TERBUTALINE SULFATE 1 MG/ML IJ SOLN: 0.2500 mg | Freq: Once | INTRAMUSCULAR | Status: DC | PRN

## 2021-06-29 MED ORDER — OXYTOCIN 10 UNIT/ML IJ SOLN: 10.0000 [IU] | Freq: Once | INTRAMUSCULAR | Status: DC | PRN

## 2021-06-29 MED ORDER — SOD CITRATE-CITRIC ACID 500-334 MG/5ML PO SOLN: 30.0000 mL | ORAL | Status: DC | PRN

## 2021-06-29 MED ORDER — NIFEDIPINE ER OSMOTIC RELEASE 30 MG PO TB24
30.0000 mg | ORAL_TABLET | Freq: Two times a day (BID) | ORAL | Status: DC
  Administered 2021-06-29 – 2021-07-01 (×5): 30 mg via ORAL
  Filled 2021-06-29 (×6): qty 1

## 2021-06-29 MED ORDER — LACTATED RINGERS IV SOLN: 500.0000 mL | INTRAVENOUS | Status: DC | PRN

## 2021-06-29 MED ORDER — OXYTOCIN-SODIUM CHLORIDE 30-0.9 UT/500ML-% IV SOLN
2.5000 [IU]/h | INTRAVENOUS | Status: DC
  Administered 2021-06-30: 2.5 [IU]/h via INTRAVENOUS
  Filled 2021-06-29: qty 500

## 2021-06-29 MED ORDER — LACTATED RINGERS IV SOLN: INTRAVENOUS | Status: DC

## 2021-06-29 MED ORDER — FENTANYL CITRATE (PF) 100 MCG/2ML IJ SOLN
100.0000 ug | INTRAMUSCULAR | Status: DC | PRN
  Administered 2021-06-29 – 2021-06-30 (×6): 100 ug via INTRAVENOUS
  Filled 2021-06-29 (×6): qty 2

## 2021-06-29 MED ORDER — OXYTOCIN-SODIUM CHLORIDE 30-0.9 UT/500ML-% IV SOLN
1.0000 m[IU]/min | INTRAVENOUS | Status: DC
  Administered 2021-06-29: 2 m[IU]/min via INTRAVENOUS
  Filled 2021-06-29: qty 500

## 2021-06-29 MED ORDER — ONDANSETRON HCL 4 MG/2ML IJ SOLN
4.0000 mg | Freq: Four times a day (QID) | INTRAMUSCULAR | Status: DC | PRN
  Administered 2021-06-29: 4 mg via INTRAVENOUS
  Filled 2021-06-29: qty 2

## 2021-06-29 MED ORDER — ACETAMINOPHEN 325 MG PO TABS: 650.0000 mg | ORAL_TABLET | ORAL | Status: DC | PRN

## 2021-06-29 MED ORDER — MISOPROSTOL 50MCG HALF TABLET
ORAL_TABLET | ORAL | Status: AC
  Filled 2021-06-29: qty 1

## 2021-06-29 NOTE — Progress Notes (Signed)
Labor Progress Note Tracy Black is a 35 y.o. [id number] at [redacted]w[redacted]d presented for IOL d/t poorly controlled HTN.   S: Feels like her contractions have spaced out.   O:  BP (!) 156/105   Pulse 86   Temp 97.6 F (36.4 C) (Axillary)   Resp 17   Ht 5\' 7"  (1.702 m)   Wt 88.5 kg   LMP 10/13/2020   BMI 30.57 kg/m  EFM: 130/mod/15x15/none  CVE: Dilation: 2.5 Effacement (%): 50 Cervical Position: Posterior Station: -3 Presentation: Vertex Exam by:: [email or web address] RN   A&P: 35 y.o. 32 [redacted]w[redacted]d  #Labor: Checked earlier by RN about 2.5 hours ago and about 2.5 cm, checked now and unchanged. Offered FB placement, however patient declined. Will proceed with pit 2x2, plan for AROM hopefully in the next 1-2 checks.  #Pain: PRN  #FWB: Cat 1 #GBS negative  #Chronic hypertension: No severe range Bps. No symptoms. Cont procardia/labetalol.   [redacted]w[redacted]d, DO 5:49 PM

## 2021-06-29 NOTE — Progress Notes (Signed)
Labor Progress Note Tracy Black is a 35 y.o. [id number] at [redacted]w[redacted]d presented who for IOL due to poorly controlled cHTN.  S: Doing well. Feeling painful contractions, using nitrous and IV pain medicine as needed. No concerns at this time.  O:  BP (!) 153/99 (BP Location: Left Arm)   Pulse 86   Temp (!) 97.4 F (36.3 C) (Axillary)   Resp 18   Ht 5\' 7"  (1.702 m)   Wt 88.5 kg   LMP 10/13/2020   BMI 30.57 kg/m   EFM: Baseline 135 bpm, moderate variability, + accels, no decels  Toco: Every 2-4 minutes   CVE: Dilation: 2.5 Effacement (%): 50 Cervical Position: Posterior Station: -3 Presentation: Vertex Exam by:: Dr. [email or web address]   A&P: 35 y.o. 32 [redacted]w[redacted]d   #Labor: SVE similar to prior. Discussed AROM with patient, verbally consented. AROM performed with clear fluid. Fetal head well applied afterwards. Mom and baby tolerated this well. Will continue Pitocin and reassess in 4 hours, sooner if feeling pressure or urge to push.  #Pain: IV Fentanyl, Nitrous PRN #FWB: Cat 1  #GBS negative  #cHTN: Normal to mild range pressures. No symptoms. Recently received nighttime Labetalol and Procardia. Will monitor closely and adjust regimen as needed.   [redacted]w[redacted]d, MD 11:15 PM
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
## 2021-06-29 NOTE — Progress Notes (Signed)
Labor Progress Note Tracy Black is a 35 y.o. [id number] at [redacted]w[redacted]d presented for IOL due to poorly controlled HTN.   S: Feeling contractions really strong and coming every few minutes. Using nitrous gas.   O:  BP (!) 156/107   Pulse 77   Temp 97.6 F (36.4 C) (Axillary)   Resp 18   Ht 5\' 7"  (1.702 m)   Wt 88.5 kg   LMP 10/13/2020   BMI 30.57 kg/m  EFM: 130/mod/15x15/none  CVE: Dilation: 1 Effacement (%): 50 Cervical Position: Posterior Station: -3 Presentation: Vertex Exam by:: Dr. [email or web address]   A&P: 35 y.o. 32 [redacted]w[redacted]d  #Labor: Some progression since last check with one cytotec and does appear to be transitioning with this only as well, is very uncomfortable with contractions at this time. Will allow expectant management, recheck sooner than 4 hours if ctx spacing/less strong.  #Pain: PRN, nitrous  #FWB: Cat 1 #GBS negative   #Chronic HTN: SBP 150's, reports she hasn't taken her medications yet today. Ordered procardia and labetalol to be given. Pre-e labs WNL. No symptoms. Cont to monitor.   [redacted]w[redacted]d, DO 1:37 PM

## 2021-06-30 ENCOUNTER — Encounter (HOSPITAL_COMMUNITY): Payer: Self-pay | Admitting: Obstetrics and Gynecology

## 2021-06-30 DIAGNOSIS — O99324 Drug use complicating childbirth: Secondary | ICD-10-CM

## 2021-06-30 DIAGNOSIS — O1002 Pre-existing essential hypertension complicating childbirth: Secondary | ICD-10-CM

## 2021-06-30 DIAGNOSIS — Z3A37 37 weeks gestation of pregnancy: Secondary | ICD-10-CM

## 2021-06-30 LAB — CBC
HCT: 23.7 % — ABNORMAL LOW (ref 36.0–46.0)
Hemoglobin: 7.8 g/dL — ABNORMAL LOW (ref 12.0–15.0)
MCH: 28.3 pg (ref 26.0–34.0)
MCHC: 32.9 g/dL (ref 30.0–36.0)
MCV: 85.9 fL (ref 80.0–100.0)
Platelets: 308 10*3/uL (ref 150–400)
RBC: 2.76 MIL/uL — ABNORMAL LOW (ref 3.87–5.11)
RDW: 14.1 % (ref 11.5–15.5)
WBC: 15.2 10*3/uL — ABNORMAL HIGH (ref 4.0–10.5)
nRBC: 0 % (ref 0.0–0.2)

## 2021-06-30 MED ORDER — ONDANSETRON HCL 4 MG/2ML IJ SOLN: 4.0000 mg | INTRAMUSCULAR | Status: DC | PRN

## 2021-06-30 MED ORDER — SIMETHICONE 80 MG PO CHEW: 80.0000 mg | CHEWABLE_TABLET | ORAL | Status: DC | PRN

## 2021-06-30 MED ORDER — ONDANSETRON HCL 4 MG PO TABS: 4.0000 mg | ORAL_TABLET | ORAL | Status: DC | PRN

## 2021-06-30 MED ORDER — DIPHENHYDRAMINE HCL 25 MG PO CAPS: 25.0000 mg | ORAL_CAPSULE | Freq: Four times a day (QID) | ORAL | Status: DC | PRN

## 2021-06-30 MED ORDER — TRANEXAMIC ACID-NACL 1000-0.7 MG/100ML-% IV SOLN
INTRAVENOUS | Status: AC
  Administered 2021-06-30: 1000 mg via INTRAVENOUS
  Filled 2021-06-30: qty 100

## 2021-06-30 MED ORDER — ACETAMINOPHEN 325 MG PO TABS: 650.0000 mg | ORAL_TABLET | ORAL | Status: DC | PRN

## 2021-06-30 MED ORDER — WITCH HAZEL-GLYCERIN EX PADS: 1.0000 "application " | MEDICATED_PAD | CUTANEOUS | Status: DC | PRN

## 2021-06-30 MED ORDER — FERROUS SULFATE 325 (65 FE) MG PO TABS
325.0000 mg | ORAL_TABLET | ORAL | Status: DC
  Administered 2021-06-30: 325 mg via ORAL
  Filled 2021-06-30: qty 1

## 2021-06-30 MED ORDER — TRANEXAMIC ACID-NACL 1000-0.7 MG/100ML-% IV SOLN: 1000.0000 mg | INTRAVENOUS | Status: AC

## 2021-06-30 MED ORDER — IBUPROFEN 600 MG PO TABS
600.0000 mg | ORAL_TABLET | Freq: Four times a day (QID) | ORAL | Status: DC
  Administered 2021-06-30 – 2021-07-01 (×5): 600 mg via ORAL
  Filled 2021-06-30 (×5): qty 1

## 2021-06-30 MED ORDER — DIBUCAINE (PERIANAL) 1 % EX OINT: 1.0000 "application " | TOPICAL_OINTMENT | CUTANEOUS | Status: DC | PRN

## 2021-06-30 MED ORDER — BENZOCAINE-MENTHOL 20-0.5 % EX AERO: 1.0000 "application " | INHALATION_SPRAY | CUTANEOUS | Status: DC | PRN

## 2021-06-30 MED ORDER — COCONUT OIL OIL: 1.0000 "application " | TOPICAL_OIL | Status: DC | PRN

## 2021-06-30 MED ORDER — TETANUS-DIPHTH-ACELL PERTUSSIS 5-2.5-18.5 LF-MCG/0.5 IM SUSY
0.5000 mL | PREFILLED_SYRINGE | Freq: Once | INTRAMUSCULAR | Status: AC
  Administered 2021-07-01: 0.5 mL via INTRAMUSCULAR
  Filled 2021-06-30: qty 0.5

## 2021-06-30 MED ORDER — DIPHENHYDRAMINE HCL 50 MG/ML IJ SOLN
12.5000 mg | Freq: Once | INTRAMUSCULAR | Status: AC
  Administered 2021-06-30: 12.5 mg via INTRAVENOUS
  Filled 2021-06-30: qty 1

## 2021-06-30 MED ORDER — PRENATAL MULTIVITAMIN CH
1.0000 | ORAL_TABLET | Freq: Every day | ORAL | Status: DC
  Administered 2021-06-30 – 2021-07-01 (×2): 1 via ORAL
  Filled 2021-06-30 (×2): qty 1

## 2021-06-30 MED ORDER — SENNOSIDES-DOCUSATE SODIUM 8.6-50 MG PO TABS
2.0000 | ORAL_TABLET | Freq: Every day | ORAL | Status: DC
  Filled 2021-06-30: qty 2

## 2021-06-30 NOTE — Progress Notes (Signed)
Late morning CBC showing Hgb 7.8, down from 9.1 on admission.   Ms Geerdes notes she has had minimal bleeding since her delivery, thin without large clots. She has been up walking around and denies dizziness, chest pain, shortness of breath with ambulation.  Discussed IV iron, she declines at this time. Will start PO ferrous sulfate every other day.  Answered all questions and concerns.  Burley Saver MD

## 2021-06-30 NOTE — Discharge Summary (Signed)
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 yo [id number] 346w1das admitted to the hospital 06/29/2021 for induction of labor.  Indication for induction:  Chronic hypertension .  Patient had an uncomplicated labor course as follows: Membrane Rupture Time/Date: 10:42 PM ,06/29/2021   Delivery Method: Vaginal, Spontaneous  Episiotomy: None  Lacerations:  1st degree;Perineal  Details of delivery can be found in separate delivery note. Patient had a routine postpartum course. She was continued on her Procardia 30 mg BID postpartum with adequate BP control. She will start a short course of Lasix upon discharge. Her hemoglobin was checked postpartum after 800 cc blood loss at delivery. Her hemoglobin went from 9.1 to 7.8. She was asymptomatic and started on oral iron which she will continue upon discharge. She is meeting all milestones without concern. Patient is discharged home 07/01/21.  Newborn Data: Birth date:06/30/2021  Birth time:5:17 AM  Gender:Female  Living status:Living  Apgars:9 ,9  Weight:3250 g   Magnesium Sulfate received: No BMZ received: No Rhophylac:  N/A MMR: N/A T-DaP: Offered postpartum Flu: No Transfusion: No  Physical exam  Vitals:   06/30/21 1245 06/30/21 1701 06/30/21 2018 07/01/21 0538  BP: 122/86 (!) 132/92 (!) 131/94 119/81  Pulse: 81 80 86 78  Resp: '18 18 18 18  ' Temp: 97.7 F (36.5 C) 97.8 F (36.6 C) 98.4 F (36.9 C) 98.1 F (36.7 C)  TempSrc: Oral Oral Oral Oral  SpO2:   100% 100%  Weight:      Height:       General: alert, cooperative, and no distress Lochia: appropriate Uterine Fundus: firm and below umbilicus  DVT Evaluation: no LE edema or calf tenderness to palpation  Labs: Lab Results  Component Value Date   WBC 15.2 (H) 06/30/2021   HGB 7.8 (L) 06/30/2021   HCT 23.7 (L) 06/30/2021   MCV 85.9 06/30/2021   PLT 308 06/30/2021   CMP Latest Ref Rng & Units 06/29/2021  Glucose 70 - 99 mg/dL 77  BUN 6 - 20 mg/dL <5(L)  Creatinine 0.44 - 1.00 mg/dL 0.51  Sodium 135 - 145 mmol/L 135  Potassium 3.5 - 5.1 mmol/L 3.9  Chloride 98 - 111 mmol/L 106  CO2 22 - 32 mmol/L 23  Calcium 8.9 - 10.3 mg/dL 8.6(L)  Total Protein 6.5 - 8.1 g/dL 5.5(L)  Total Bilirubin 0.3 - 1.2 mg/dL <0.1(L)  Alkaline Phos 38 - 126 U/L 131(H)  AST 15 - 41 U/L 19  ALT 0 - 44 U/L <5   Edinburgh Score: EdFlavia Shipperostnatal  Depression Scale Screening Tool 06/30/2021  I have been able to laugh and see the funny side of things. 0  I have looked forward with enjoyment to things. 0  I have blamed myself unnecessarily when things went wrong. 2  I have been anxious or worried for no good reason. 2  I have felt scared or panicky for no good reason. 0  Things have been getting on top of me. 1  I have been so unhappy that I have had difficulty sleeping. 0  I have felt sad or miserable. 1  I have been so unhappy that I have been crying. 1  The thought of harming myself has occurred to me. 0  Edinburgh Postnatal Depression Scale Total 7     After visit meds:  Allergies as of 07/01/2021       Reactions   Lisinopril Swelling   Lip  swelling Lip swelling   Lisinopril Rash        Medication List     STOP taking these medications    aspirin EC 81 MG tablet   labetalol 200 MG tablet Commonly known as: NORMODYNE       TAKE these medications    acetaminophen 500 MG tablet Commonly known as: TYLENOL Take 2 tablets (1,000 mg total) by mouth every 8 (eight) hours as needed (pain).   Blood Pressure Kit Devi 1 each by Does not apply route once a week.   cetirizine 10 MG tablet Commonly known as: ZYRTEC Take 1 tablet (10 mg total) by mouth daily.   ferrous sulfate 325 (65 FE) MG tablet Take 1 tablet (325 mg total) by mouth every other day. Start taking on: July 02, 2021   furosemide 20 MG tablet Commonly known as: Lasix Take 1 tablet (20 mg total) by mouth daily for 3 days.   ibuprofen 600 MG tablet Commonly known as: ADVIL Take 1 tablet (600 mg total) by mouth every 6 (six) hours as needed (pain).   multivitamin-prenatal 27-0.8 MG Tabs tablet Take 1 tablet by mouth daily after breakfast.   NIFEdipine 30 MG 24 hr tablet Commonly known as: Procardia XL Take 1 tablet (30 mg total) by mouth 2 (two) times daily. What changed: when to take this         Discharge home in stable condition Infant Feeding: Breast Infant Disposition: home with mother Discharge instruction: per After Visit Summary and Postpartum booklet. Activity: Advance as tolerated. Pelvic rest for 6 weeks.  Diet: routine diet Future Appointments: Future Appointments  Date Time Provider [place]  07/07/2021  3:00 PM [facility] NURSE [facility]  08/04/2021  2:15 PM Aletha Halim, MD [facility]   Follow up Visit: Message sent to [facility] by Dr. Gwenlyn Perking on 06/30/21.   Please schedule this patient for a In person postpartum visit in  4-6 weeks  with the following provider: Any provider. Additional Postpartum F/U: BP check 1 week, Babyscripts order placed  High risk pregnancy complicated by: HTN Delivery mode:   Vaginal, Spontaneous  Anticipated Birth Control:  IUD outpatient   07/01/2021 Genia Del, MD

## 2021-06-30 NOTE — Lactation Note (Signed)
This note was copied from a baby's chart. Lactation Consultation Note  Patient Name: Boy Rachell Druckenmiller [id number]'O Date: 06/30/2021 Reason for consult: Initial assessment;Early term 37-38.6wks Age:33 hours P3, ETI female infant. Per mom, infant is starting to improve on breastfeeding, recently feeding infant breastfeed for 15 minutes at 2215 pm, LC did not observe latch at this time. Mom was shown with breast model how to hand express and she self expressed 1 ml of colostrum that was spoon fed to infant.  LC dicussed doing breast compressions to keep infant awake, gently stroking infant's neck and shoulder, talking to infant and breastfeed infant skin to skin.  Mom knows to breastfeed infant according to feeding cues, 8 to 12 or more times within 24 hours, skin to skin and to ask RN/LC for latch assistance if needed.  Mom made aware of O/P services, breastfeeding support groups, community resources, and our phone # for post-discharge questions.   Maternal Data Has patient been taught Hand Expression?: Yes Does the patient have breastfeeding experience prior to this delivery?: Yes How long did the patient breastfeed?: Per mom, she breastfeed her 1st child for 8 months , 2nd child for 6 months who is currently 62 years old.  Feeding Mother's Current Feeding Choice: Breast Milk  LATCH Score              Lactation Tools Discussed/Used    Interventions Interventions: Breast feeding basics reviewed;Skin to skin;Hand express;Breast compression;Position options;Education;LC Services brochure  Discharge    Consult Status Consult Status: Follow-up Date: 07/01/21 Follow-up type: In-patient    Danelle Earthly 06/30/2021, 10:54 PM

## 2021-07-01 ENCOUNTER — Other Ambulatory Visit (HOSPITAL_COMMUNITY): Payer: Self-pay

## 2021-07-01 MED ORDER — FUROSEMIDE 20 MG PO TABS
20.0000 mg | ORAL_TABLET | Freq: Every day | ORAL | 0 refills | Status: DC
  Filled 2021-07-01: qty 3, 3d supply, fill #0

## 2021-07-01 MED ORDER — FERROUS SULFATE 325 (65 FE) MG PO TABS
325.0000 mg | ORAL_TABLET | ORAL | 1 refills | Status: DC
  Filled 2021-07-01: qty 30, 60d supply, fill #0

## 2021-07-01 MED ORDER — IBUPROFEN 600 MG PO TABS
600.0000 mg | ORAL_TABLET | Freq: Four times a day (QID) | ORAL | 0 refills | Status: DC | PRN
  Filled 2021-07-01: qty 40, 10d supply, fill #0

## 2021-07-01 MED ORDER — ACETAMINOPHEN 500 MG PO TABS
1000.0000 mg | ORAL_TABLET | Freq: Three times a day (TID) | ORAL | 0 refills | Status: DC | PRN
  Filled 2021-07-01: qty 60, 10d supply, fill #0
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
## 2021-07-01 NOTE — Lactation Note (Signed)
This note was copied from a baby's chart. Lactation Consultation Note  Patient Name: Tracy Black [id number] Date: 07/01/2021 Reason for consult: Follow-up assessment;Early term 37-38.6wks;Infant weight loss;Maternal endocrine disorder;Other (Comment);Breastfeeding assistance (NP Bethann Humble / requested LC  patient to assess for D/C. Baby  asleep to start/ woke up to a wet diaper.( large ). Routing / LC offered to assist to show mom the cross cradle / deep latch achieved / multiple swallows. LC reviewed BF D/C teaching.) Age:85 hours  Maternal Data Does the patient have breastfeeding experience prior to this delivery?: Yes  Feeding Mother's Current Feeding Choice: Breast Milk  LATCH Score Latch: Grasps breast easily, tongue down, lips flanged, rhythmical sucking.  Audible Swallowing: Spontaneous and intermittent  Type of Nipple: Everted at rest and after stimulation  Comfort (Breast/Nipple): Filling, red/small blisters or bruises, mild/mod discomfort  Hold (Positioning): Assistance needed to correctly position infant at breast and maintain latch.  LATCH Score: 8   Lactation Tools Discussed/Used Tools: Pump;Flanges Flange Size: 21;24 Breast pump type: Manual Pump Education: Setup, frequency, and cleaning;Milk Storage Reason for Pumping: PRN  Interventions Interventions: Breast feeding basics reviewed;Assisted with latch;Skin to skin;Breast massage;Hand express;Breast compression;Adjust position;Position options;Support pillows;Education;Hand pump;LC Services brochure  Discharge Discharge Education: Engorgement and breast care;Warning signs for feeding baby Pump: Manual  Consult Status Consult Status: Complete Date: 07/01/21 Follow-up type: In-patient    Tracy Black 07/01/2021, 3:27 PM

## 2021-07-03 NOTE — Progress Notes (Signed)
Attestation of Attending Supervision of clinical support staff: I agree with the care provided to this patient and was available for any consultation.  I have reviewed the RN's note and chart. I was available for consult and to see the patient if needed.   Tasheika Kitzmiller MD MPH Attending Physician Faculty Practice- [facility]  

## 2021-07-07 ENCOUNTER — Encounter: Payer: Self-pay | Admitting: *Deleted

## 2021-07-07 ENCOUNTER — Ambulatory Visit: Payer: Medicaid Other

## 2021-07-15 ENCOUNTER — Telehealth (HOSPITAL_COMMUNITY): Payer: Self-pay | Admitting: *Deleted

## 2021-07-15 NOTE — Telephone Encounter (Signed)
Patient voiced no questions or concerns at this time. EPDS= 6. Stated that she missed her follow-up appointment with her OB - has phone number and voiced plans to call and reschedule. Patient voiced no questions or concerns regarding infant at this time. Patient reports infant sleeps in a bassinet on his back. RN reviewed ABCs of safe sleep. Patient verbalized understanding. Patient informed of  hospital's virtual postpartum classes and support groups.Declined email information. Deforest Hoyles, RN, 07/15/21, [phone]

## 2021-08-04 ENCOUNTER — Ambulatory Visit: Payer: Medicaid Other | Admitting: Obstetrics and Gynecology

## 2021-08-04 ENCOUNTER — Encounter: Payer: Self-pay | Admitting: Obstetrics and Gynecology

## 2021-08-06 NOTE — Progress Notes (Signed)
Patient did not keep her postpartum appointment for 08/04/2021. ° °Vanesa Renier, Jr MD °Attending °[facility] (Faculty Practice) °

## 2021-10-31 ENCOUNTER — Ambulatory Visit (HOSPITAL_COMMUNITY): Payer: Self-pay

## 2022-02-02 ENCOUNTER — Emergency Department (HOSPITAL_COMMUNITY)
Admission: EM | Admit: 2022-02-02 | Discharge: 2022-02-03 | Disposition: A | Discharge status: Home / Self Care | Payer: Medicaid Other | Attending: Emergency Medicine | Admitting: Emergency Medicine

## 2022-02-02 ENCOUNTER — Other Ambulatory Visit: Payer: Self-pay

## 2022-02-02 ENCOUNTER — Encounter (HOSPITAL_COMMUNITY): Payer: Self-pay | Admitting: Emergency Medicine

## 2022-02-02 DIAGNOSIS — F1990 Other psychoactive substance use, unspecified, uncomplicated: Secondary | ICD-10-CM

## 2022-02-02 DIAGNOSIS — Z20822 Contact with and (suspected) exposure to covid-19: Secondary | ICD-10-CM | POA: Diagnosis not present

## 2022-02-02 DIAGNOSIS — R000 Tachycardia, unspecified: Secondary | ICD-10-CM | POA: Insufficient documentation

## 2022-02-02 DIAGNOSIS — N179 Acute kidney failure, unspecified: Secondary | ICD-10-CM

## 2022-02-02 DIAGNOSIS — Z79899 Other long term (current) drug therapy: Secondary | ICD-10-CM | POA: Diagnosis not present

## 2022-02-02 DIAGNOSIS — R238 Other skin changes: Secondary | ICD-10-CM

## 2022-02-02 DIAGNOSIS — L210 Seborrhea capitis: Secondary | ICD-10-CM | POA: Insufficient documentation

## 2022-02-02 DIAGNOSIS — D72829 Elevated white blood cell count, unspecified: Secondary | ICD-10-CM | POA: Diagnosis not present

## 2022-02-02 DIAGNOSIS — F99 Mental disorder, not otherwise specified: Secondary | ICD-10-CM | POA: Diagnosis not present

## 2022-02-02 DIAGNOSIS — I10 Essential (primary) hypertension: Secondary | ICD-10-CM | POA: Insufficient documentation

## 2022-02-02 DIAGNOSIS — R7309 Other abnormal glucose: Secondary | ICD-10-CM | POA: Insufficient documentation

## 2022-02-02 DIAGNOSIS — R1032 Left lower quadrant pain: Secondary | ICD-10-CM | POA: Insufficient documentation

## 2022-02-02 DIAGNOSIS — F1910 Other psychoactive substance abuse, uncomplicated: Secondary | ICD-10-CM

## 2022-02-02 DIAGNOSIS — L739 Follicular disorder, unspecified: Secondary | ICD-10-CM

## 2022-02-02 MED ORDER — LORAZEPAM 1 MG PO TABS
1.0000 mg | ORAL_TABLET | Freq: Once | ORAL | Status: AC
Start: 2022-02-02 — End: 2022-02-02
  Administered 2022-02-02: 1 mg via ORAL
  Filled 2022-02-02: qty 1

## 2022-02-02 NOTE — ED Provider Triage Note (Addendum)
Emergency Medicine Provider Triage Evaluation Note  Tracy Black , a 36 y.o. female  was evaluated in triage.  Pt came in hysterically crying hyperventilating.  She reports that she had a staph infection in her hair year ago with some hair falling out and she did not want that to happen again so she decided that she was going to shave her head and she started having some bleeding on her scalp.  The patient came in clutching ice to her chest and was hysterical he denies any HI or SI.  Denies any hallucinations.  Review of Systems  Positive:  Negative:   Physical Exam  BP (!) 174/131 (BP Location: Left Arm)   Pulse (!) 150   Resp (!) 26   SpO2 100%  Gen:   Awake, no distress   Resp:  Normal effort  MSK:   Moves extremities without difficulty  Other:  Multiple superfical abrasions noted to scalp  Medical Decision Making  Medically screening exam initiated at 6:32 PM.  Appropriate orders placed.  Gerard Bonus was informed that the remainder of the evaluation will be completed by another provider, this initial triage assessment does not replace that evaluation, and the importance of remaining in the ED until their evaluation is complete.  Concern for possible drug use or new onset psych problem.  Will order medical clearance labs.  Ativan ordered.   Achille Rich, PA-C 02/02/22 1833    Achille Rich, PA-C 02/02/22 1834

## 2022-02-02 NOTE — ED Notes (Signed)
Floyce Stakes [phone] would like phone call when discharged or an update on patient status

## 2022-02-02 NOTE — ED Triage Notes (Addendum)
Pt presents to ED with blood on head, states she was "cutting her hair off for a fresh start" d/t "frequent infections"  Pt has blood on her scalp and a towel and ice pack around her head . Pt is hyperventilating and tachycardic at time of triage.  Pt has hx of hypertension, uncontrolled.  Pt has no frank lacerations to scalp but has blood noted to scalp that appears to be from clippers she used to shave head . Denies HI/SI or hallucinations.

## 2022-02-03 ENCOUNTER — Emergency Department (HOSPITAL_COMMUNITY): Payer: Medicaid Other

## 2022-02-03 DIAGNOSIS — F1990 Other psychoactive substance use, unspecified, uncomplicated: Secondary | ICD-10-CM

## 2022-02-03 DIAGNOSIS — N179 Acute kidney failure, unspecified: Secondary | ICD-10-CM

## 2022-02-03 DIAGNOSIS — L739 Follicular disorder, unspecified: Secondary | ICD-10-CM

## 2022-02-03 DIAGNOSIS — F1910 Other psychoactive substance abuse, uncomplicated: Secondary | ICD-10-CM

## 2022-02-03 LAB — I-STAT VENOUS BLOOD GAS, ED
Acid-base deficit: 5 mmol/L — ABNORMAL HIGH (ref 0.0–2.0)
Bicarbonate: 21.4 mmol/L (ref 20.0–28.0)
Calcium, Ion: 1.13 mmol/L — ABNORMAL LOW (ref 1.15–1.40)
HCT: 36 % (ref 36.0–46.0)
Hemoglobin: 12.2 g/dL (ref 12.0–15.0)
O2 Saturation: 57 %
Potassium: 3.3 mmol/L — ABNORMAL LOW (ref 3.5–5.1)
Sodium: 138 mmol/L (ref 135–145)
TCO2: 23 mmol/L (ref 22–32)
pCO2, Ven: 42.1 mmHg — ABNORMAL LOW (ref 44–60)
pH, Ven: 7.314 (ref 7.25–7.43)
pO2, Ven: 33 mmHg (ref 32–45)

## 2022-02-03 LAB — COMPREHENSIVE METABOLIC PANEL
ALT: 13 U/L (ref 0–44)
AST: 29 U/L (ref 15–41)
Albumin: 5.2 g/dL — ABNORMAL HIGH (ref 3.5–5.0)
Alkaline Phosphatase: 90 U/L (ref 38–126)
Anion gap: 17 — ABNORMAL HIGH (ref 5–15)
BUN: 27 mg/dL — ABNORMAL HIGH (ref 6–20)
CO2: 21 mmol/L — ABNORMAL LOW (ref 22–32)
Calcium: 10 mg/dL (ref 8.9–10.3)
Chloride: 103 mmol/L (ref 98–111)
Creatinine, Ser: 2.16 mg/dL — ABNORMAL HIGH (ref 0.44–1.00)
GFR, Estimated: 30 mL/min — ABNORMAL LOW (ref >60)
Glucose, Bld: 108 mg/dL — ABNORMAL HIGH (ref 70–99)
Potassium: 3.5 mmol/L (ref 3.5–5.1)
Sodium: 141 mmol/L (ref 135–145)
Total Bilirubin: 2.2 mg/dL — ABNORMAL HIGH (ref 0.3–1.2)
Total Protein: 8.9 g/dL — ABNORMAL HIGH (ref 6.5–8.1)

## 2022-02-03 LAB — CBC WITH DIFFERENTIAL/PLATELET
Abs Immature Granulocytes: 0.03 10*3/uL (ref 0.00–0.07)
Basophils Absolute: 0.1 10*3/uL (ref 0.0–0.1)
Basophils Relative: 1 %
Eosinophils Absolute: 0 10*3/uL (ref 0.0–0.5)
Eosinophils Relative: 0 %
HCT: 41.7 % (ref 36.0–46.0)
Hemoglobin: 14 g/dL (ref 12.0–15.0)
Immature Granulocytes: 0 %
Lymphocytes Relative: 25 %
Lymphs Abs: 3 10*3/uL (ref 0.7–4.0)
MCH: 28.5 pg (ref 26.0–34.0)
MCHC: 33.6 g/dL (ref 30.0–36.0)
MCV: 84.8 fL (ref 80.0–100.0)
Monocytes Absolute: 0.9 10*3/uL (ref 0.1–1.0)
Monocytes Relative: 8 %
Neutro Abs: 8 10*3/uL — ABNORMAL HIGH (ref 1.7–7.7)
Neutrophils Relative %: 66 %
Platelets: 391 10*3/uL (ref 150–400)
RBC: 4.92 MIL/uL (ref 3.87–5.11)
RDW: 16.5 % — ABNORMAL HIGH (ref 11.5–15.5)
WBC: 12.1 10*3/uL — ABNORMAL HIGH (ref 4.0–10.5)
nRBC: 0 % (ref 0.0–0.2)

## 2022-02-03 LAB — URINALYSIS, ROUTINE W REFLEX MICROSCOPIC
Bilirubin Urine: NEGATIVE
Glucose, UA: NEGATIVE mg/dL
Hgb urine dipstick: NEGATIVE
Ketones, ur: 20 mg/dL — AB
Leukocytes,Ua: NEGATIVE
Nitrite: NEGATIVE
Protein, ur: 100 mg/dL — AB
Specific Gravity, Urine: 1.023 (ref 1.005–1.030)
pH: 5 (ref 5.0–8.0)

## 2022-02-03 LAB — BASIC METABOLIC PANEL
Anion gap: 17 — ABNORMAL HIGH (ref 5–15)
Anion gap: 8 (ref 5–15)
BUN: 25 mg/dL — ABNORMAL HIGH (ref 6–20)
BUN: 19 mg/dL (ref 6–20)
CO2: 20 mmol/L — ABNORMAL LOW (ref 22–32)
CO2: 19 mmol/L — ABNORMAL LOW (ref 22–32)
Calcium: 8.9 mg/dL (ref 8.9–10.3)
Calcium: 7.6 mg/dL — ABNORMAL LOW (ref 8.9–10.3)
Chloride: 101 mmol/L (ref 98–111)
Chloride: 111 mmol/L (ref 98–111)
Creatinine, Ser: 1.61 mg/dL — ABNORMAL HIGH (ref 0.44–1.00)
Creatinine, Ser: 1.15 mg/dL — ABNORMAL HIGH (ref 0.44–1.00)
GFR, Estimated: 43 mL/min — ABNORMAL LOW (ref >60)
GFR, Estimated: >60 mL/min (ref >60)
Glucose, Bld: 82 mg/dL (ref 70–99)
Glucose, Bld: 73 mg/dL (ref 70–99)
Potassium: 3.4 mmol/L — ABNORMAL LOW (ref 3.5–5.1)
Potassium: 3.2 mmol/L — ABNORMAL LOW (ref 3.5–5.1)
Sodium: 138 mmol/L (ref 135–145)
Sodium: 138 mmol/L (ref 135–145)

## 2022-02-03 LAB — RAPID URINE DRUG SCREEN, HOSP PERFORMED
Amphetamines: NOT DETECTED
Barbiturates: NOT DETECTED
Benzodiazepines: POSITIVE — AB
Cocaine: POSITIVE — AB
Opiates: NOT DETECTED
Tetrahydrocannabinol: POSITIVE — AB

## 2022-02-03 LAB — CBG MONITORING, ED
Glucose-Capillary: 66 mg/dL — ABNORMAL LOW (ref 70–99)
Glucose-Capillary: 116 mg/dL — ABNORMAL HIGH (ref 70–99)

## 2022-02-03 LAB — RESP PANEL BY RT-PCR (FLU A&B, COVID) ARPGX2
Influenza A by PCR: NEGATIVE
Influenza B by PCR: NEGATIVE
SARS Coronavirus 2 by RT PCR: NEGATIVE

## 2022-02-03 LAB — HCG, QUANTITATIVE, PREGNANCY: hCG, Beta Chain, Quant, S: <1 m[IU]/mL (ref <5)

## 2022-02-03 LAB — BETA-HYDROXYBUTYRIC ACID: Beta-Hydroxybutyric Acid: 1.65 mmol/L — ABNORMAL HIGH (ref 0.05–0.27)

## 2022-02-03 LAB — CK: Total CK: 329 U/L — ABNORMAL HIGH (ref 38–234)

## 2022-02-03 LAB — ETHANOL: Alcohol, Ethyl (B): <10 mg/dL (ref <10)

## 2022-02-03 MED ORDER — SODIUM CHLORIDE 0.9 % IV SOLN: INTRAVENOUS | Status: DC

## 2022-02-03 MED ORDER — SODIUM CHLORIDE 0.9 % IV BOLUS
1000.0000 mL | Freq: Once | INTRAVENOUS | Status: AC
  Administered 2022-02-03: 1000 mL via INTRAVENOUS

## 2022-02-03 MED ORDER — IOHEXOL 9 MG/ML PO SOLN
ORAL | Status: AC
  Filled 2022-02-03: qty 1000

## 2022-02-03 MED ORDER — POTASSIUM CHLORIDE CRYS ER 20 MEQ PO TBCR
60.0000 meq | EXTENDED_RELEASE_TABLET | Freq: Once | ORAL | Status: AC
Start: 2022-02-03 — End: 2022-02-03
  Administered 2022-02-03: 60 meq via ORAL
  Filled 2022-02-03: qty 3

## 2022-02-03 NOTE — ED Provider Notes (Signed)
  Physical Exam  BP 133/82   Pulse 75   Temp 98.6 F (37 C)   Resp 16   SpO2 100%   Physical Exam  Procedures  Procedures  ED Course / MDM   Clinical Course as of 02/03/22 1725  Tue Feb 03, 2022  0900 I reviewed results of CT abdomen pelvis, radiologist notes no acute intra-abdominal pathologies identified. [BM]  1105 Urine rapid drug screen (hosp performed)(!) Positive for cocaine and THC, likely contributing to presentation last night.  Benzodiazepine positive may be secondary to Ativan given earlier. [BM]  1106 Basic metabolic panel(!) BMP shows some improvement of creatinine at 1.61 down from 2.16.  Mild hypokalemia of 3.4.  Anion gap of 17 and bicarb of 20 similar to prior last night.  Discussed with Dr. Posey Rea will add on a beta hydroxybutyric acid and VBG. [BM]  1109 Urinalysis, Routine w reflex microscopic Urine, Clean Catch(!) Urinalysis shows 20 ketones suspect secondary to dehydration.  Many bacteria and 6-10 WBCs are present, leukocyte and nitrite negative.  Suspect dirty collection, low suspicion for UTI. [BM]  1220 Patient reassessed she is resting comfortably bed no acute distress.  She reports that she was concerned for possible staph infection of her hair last night which is her primary reason for presentation, she reports she gets staph infections around once a year and normally resolves after she shaves her head which she did yesterday.  She has a small linear abrasion to her scalp which patient reports occurred when she was shaving her head last night, no evidence of infection on my examination.  No evidence for significant head injury on my evaluation.  She reports she is feeling well at this time, she reports that she had 2 episodes of emesis few days ago which is why she believes she is dehydrated.  She reports has not used alcohol or cocaine since July 6.  Patient denies SI/HI, hallucinations or any additional concerns.  Patient currently fully alert and oriented. [BM]   1223 ED EKG Repeat EKG shows resolution of tachycardia.  No obvious acute ischemic changes on my interpretation. [BM]  1445 Family medicine, Dr. Ashok Cordia is coming to see the patient. [BM]    Clinical Course User Index [BM] Bill Salinas, PA-C   Medical Decision Making Amount and/or Complexity of Data Reviewed Labs: ordered. Decision-making details documented in ED Course. Radiology: ordered. ECG/medicine tests:  Decision-making details documented in ED Course.  Risk Prescription drug management.   Patient assessed by family medicine, and per their recommendations,Patient overall seems stable for discharge.  They recommend repeating a BMP to assess for improving creatinine.  If improved, can likely discharge home, however if it is worsened or not improved able consider admission for IV fluids.  I evaluated patient's CT abdomen pelvis ordered by previous provider with negative for acute findings.  Repeat BMP showed improved creatinine down to 1.16.  She was provided substance use resources with inpatient and outpatient treatment.  Discharged home in good condition.       Delight Ovens 02/03/22 1727    Benjiman Core, MD 02/03/22 2235

## 2022-02-03 NOTE — Assessment & Plan Note (Signed)
Scalp without any lesions.

## 2022-02-03 NOTE — Discharge Instructions (Addendum)
Please continue to drink plenty of water to hydrate your kidneys and follow up with your PCP. I have attached a list here for you of substance use rehabs - both inpatient and outpatient.   Please go to the [facility] on [address] for a blood draw on 7/20 to ensure your kidneys are improving. No follow-ups on file.

## 2022-02-03 NOTE — Assessment & Plan Note (Addendum)
Creatinine elevated to 2.16 on arrival to the ED, improved with IV fluids to 1.61 but still not at baseline of.  Likely prerenal secondary to decreased p.o. intake the past few days.  Also likely affected by cocaine use. - Repeat BMP stat, if improved creatinine can likely discharge. If worsened or no improvement, can possibly admit for IVF

## 2022-02-03 NOTE — Assessment & Plan Note (Signed)
UDS+ cocaine, THC, benzodiazepines. Admits to use most recently 7/6, but likely also used prior to coming to ER last night 7/17 given slurred speech/odd behavior. - TOC consult STAT for substance use resources

## 2022-02-03 NOTE — ED Notes (Signed)
Pt provided with second half of the oral contrast solution

## 2022-02-03 NOTE — ED Provider Notes (Signed)
[facility] EMERGENCY DEPARTMENT Provider Note   CSN: [id number] Arrival date & time: 02/02/22  1820     History  Chief Complaint  Patient presents with   Mental eval    Tracy Black is a 36 y.o. female.  36 y/o female with hx of HTN, polysubstance use presents to the ED for evaluation.  She reports that she has concern for a staph infection to her scalp.  As a result, she began to cut all of her hair.  States that staph infection can sometimes be in her nose or mouth.  Alleges she last used an antibiotic cream a few weeks ago for this.  Secondarily states she has pain in her lower abdomen.  No fevers, melena, hematochezia, dysuria.  She does snort cocaine and appears intoxicated at present.  Denies IVDU.  The history is provided by the patient. No language interpreter was used.      Home Medications Prior to Admission medications   Medication Sig Start Date End Date Taking? Authorizing Provider  acetaminophen (TYLENOL) 500 MG tablet Take 2 tablets (1,000 mg total) by mouth every 8 (eight) hours as needed (pain). 07/01/21   Genia Del, MD  Blood Pressure Monitoring (BLOOD PRESSURE KIT) DEVI 1 each by Does not apply route once a week. 01/29/21   Gabriel Carina, CNM  cetirizine (ZYRTEC) 10 MG tablet Take 1 tablet (10 mg total) by mouth daily. 01/29/21   Gabriel Carina, CNM  ferrous sulfate 325 (65 FE) MG tablet Take 1 tablet (325 mg total) by mouth every other day. 07/02/21   Genia Del, MD  furosemide (LASIX) 20 MG tablet Take 1 tablet (20 mg total) by mouth daily for 3 days. 07/01/21 07/04/21  Genia Del, MD  ibuprofen (ADVIL) 600 MG tablet Take 1 tablet (600 mg total) by mouth every 6 (six) hours as needed (pain). 07/01/21   Genia Del, MD  NIFEdipine (PROCARDIA XL) 30 MG 24 hr tablet Take 1 tablet (30 mg total) by mouth 2 (two) times daily. Patient taking differently: Take 30 mg by mouth daily. 05/05/21   Laury Deep,  CNM  Prenatal Vit-Fe Fumarate-FA (MULTIVITAMIN-PRENATAL) 27-0.8 MG TABS tablet Take 1 tablet by mouth daily after breakfast. 01/30/21   Gabriel Carina, CNM      Allergies    Lisinopril and Lisinopril    Review of Systems   Review of Systems Ten systems reviewed and are negative for acute change, except as noted in the HPI.    Physical Exam Updated Vital Signs BP (!) 128/94   Pulse (!) 102   Temp 98.4 F (36.9 C) (Oral)   Resp 18   SpO2 100%   Physical Exam Vitals and nursing note reviewed.  Constitutional:      General: She is not in acute distress.    Appearance: She is well-developed. She is not diaphoretic.  HENT:     Head: Normocephalic and atraumatic.     Comments: Various locks of hair have been cut down to the scalp. There is no overt injury or laceration to the scalp. No drainage, induration, erythema, tenderness.    Right Ear: External ear normal.     Left Ear: External ear normal.     Mouth/Throat:     Mouth: Mucous membranes are moist.     Comments: Clear oropharynx. No sores or ulcers. Tolerating secretions with normal phonation.  Eyes:     General: No scleral icterus.    Conjunctiva/sclera: Conjunctivae  normal.  Cardiovascular:     Rate and Rhythm: Regular rhythm. Tachycardia present.     Pulses: Normal pulses.  Pulmonary:     Effort: Pulmonary effort is normal. No respiratory distress.     Comments: Respirations even and unlabored Abdominal:     Palpations: Abdomen is soft. There is no mass.     Tenderness: There is abdominal tenderness. There is no guarding.     Comments: Soft, nondistended. Mild TTP in the LLQ without palpable masses. No peritoneal signs.  Musculoskeletal:        General: Normal range of motion.     Cervical back: Normal range of motion.  Skin:    General: Skin is warm and dry.     Coloration: Skin is not pale.     Findings: No erythema or rash.  Neurological:     Mental Status: She is alert and oriented to person, place, and  time.     Coordination: Coordination normal.  Psychiatric:        Speech: Speech is slurred.        Behavior: Behavior is slowed.        Thought Content: Thought content does not include homicidal or suicidal ideation.     ED Results / Procedures / Treatments   Labs (all labs ordered are listed, but only abnormal results are displayed) Labs Reviewed  COMPREHENSIVE METABOLIC PANEL - Abnormal; Notable for the following components:      Result Value   CO2 21 (*)    Glucose, Bld 108 (*)    BUN 27 (*)    Creatinine, Ser 2.16 (*)    Total Protein 8.9 (*)    Albumin 5.2 (*)    Total Bilirubin 2.2 (*)    GFR, Estimated 30 (*)    Anion gap 17 (*)    All other components within normal limits  CBC WITH DIFFERENTIAL/PLATELET - Abnormal; Notable for the following components:   WBC 12.1 (*)    RDW 16.5 (*)    Neutro Abs 8.0 (*)    All other components within normal limits  RESP PANEL BY RT-PCR (FLU A&B, COVID) ARPGX2  ETHANOL  HCG, QUANTITATIVE, PREGNANCY  RAPID URINE DRUG SCREEN, HOSP PERFORMED  URINALYSIS, ROUTINE W REFLEX MICROSCOPIC    EKG None  Radiology No results found.  Procedures Procedures    Medications Ordered in ED Medications  sodium chloride 0.9 % bolus 1,000 mL (has no administration in time range)  LORazepam (ATIVAN) tablet 1 mg (1 mg Oral Given 02/02/22 1836)  sodium chloride 0.9 % bolus 1,000 mL (1,000 mLs Intravenous New Bag/Given 02/03/22 0453)    ED Course/ Medical Decision Making/ A&P                           Medical Decision Making Amount and/or Complexity of Data Reviewed Labs: ordered. Radiology: ordered.   This patient presents to the ED for concern of scalp irritation this involves an extensive number of treatment options, and is a complaint that carries with it a high risk of complications and morbidity.  The differential diagnosis includes dermatitis vs tinea capitis vs kerion vs folliculitis   Co morbidities that complicate the patient  evaluation  HTN Polysubstance use   Lab Tests:  I Ordered, and personally interpreted labs.  The pertinent results include:  creatinine of 2.16 (up from ~0.5), anion gap of 17 likely due to ARF, nonspecific leukocytosis of 12.1.   Imaging Studies ordered:  I ordered imaging studies including CT abdomen/pelvis  Results pending   Cardiac Monitoring:  The patient was maintained on a cardiac monitor.  I personally viewed and interpreted the cardiac monitored which showed an underlying rhythm of: sinus tachycardia   Medicines ordered and prescription drug management:  I ordered medication including ativan for anxiety state on arriva. Also given IVF for presumed dehydration given AKI  I have reviewed the patients home medicines and have made adjustments as needed   Reevaluation:  After the interventions noted above, I reevaluated the patient and found that they have : remained stable   Dispostion:  Pending CT results to assist in disposition. Patient endorses established care with a PCP. Would necessitate recheck of creatinine after IVF to determine whether outpatient f/u for ARF/AKI is reasonable.    Care signed out to [place], [place] at shift change.         Final Clinical Impression(s) / ED Diagnoses Final diagnoses:  AKI (acute kidney injury) ([place])  Scalp irritation    Rx / DC Orders ED Discharge Orders     None         Antonietta Breach, PA-C 82/08/13 8871    Delora Fuel, MD 95/97/47 [phone]

## 2022-02-03 NOTE — ED Provider Notes (Addendum)
Care handoff received from Antony Madura PA-C at shift change please see previous provider note for full details of visit.  In short 36 year old female presented to the ER initially with complaints of scalp irritation.  She had comorbidities which include polysubstance abuse and hypertension. Patient was initially tachycardic and hypertensive in the ER this has somewhat improved with Ativan. Labs were performed which showed an AKI with creatinine of 2.16 up from around 0.5 along with anion gap and a nonspecific leukocytosis of 12.1.  Patient's left abdomen was somewhat tender on previous provider's exam patient is currently pending a CT abdomen pelvis.    Patient is receiving IV fluids at this time.  Plan of care at shift change is to follow-up on CT abdomen pelvis and recheck a creatinine after patient's IV fluid bolus to see if improvement.  If no acute findings and improving creatinine plan is discharged with close PCP follow-up.  Physical Exam  BP 133/82   Pulse 75   Temp 98.6 F (37 C)   Resp 16   SpO2 100%   Physical Exam Constitutional:      General: She is not in acute distress.    Appearance: Normal appearance. She is well-developed. She is not ill-appearing or diaphoretic.  HENT:     Head: Normocephalic.     Jaw: There is normal jaw occlusion. No trismus.      Comments: Superficial small linear abrasion where patient reports she scraped herself while shaving her head.  No erythema, induration, fluctuance or increased warmth.  No active bleeding    Right Ear: External ear normal. No hemotympanum.     Left Ear: External ear normal. No hemotympanum.     Nose: Nose normal.     Mouth/Throat:     Mouth: Mucous membranes are moist.     Pharynx: Oropharynx is clear. Uvula midline.  Eyes:     General: Vision grossly intact. Gaze aligned appropriately.     Extraocular Movements: Extraocular movements intact.     Conjunctiva/sclera: Conjunctivae normal.     Pupils: Pupils are equal, round,  and reactive to light.  Neck:     Trachea: Trachea and phonation normal.  Pulmonary:     Effort: Pulmonary effort is normal. No respiratory distress.  Abdominal:     General: There is no distension.     Palpations: Abdomen is soft.     Tenderness: There is no abdominal tenderness. There is no guarding or rebound.  Musculoskeletal:        General: Normal range of motion.     Cervical back: Normal range of motion and neck supple. No spinous process tenderness or muscular tenderness.     Comments: No midline spinal tense palpation.  No crepitus step-off or deformity of the spine.  No paraspinal muscular tense palpation.  All major joints of bilateral upper and lower extremities palpated and brought through range of motion without pain or deformity.  Patient has normal gait around hallway without assistance or difficulty.  Skin:    General: Skin is warm and dry.  Neurological:     Mental Status: She is alert.     GCS: GCS eye subscore is 4. GCS verbal subscore is 5. GCS motor subscore is 6.     Comments: Speech is clear and goal oriented, follows commands Major Cranial nerves without deficit, no facial droop Moves extremities without ataxia, coordination intact  Psychiatric:        Behavior: Behavior normal.     Procedures  Procedures  ED Course / MDM   Clinical Course as of 02/03/22 1538  Tue Feb 03, 2022  0900 I reviewed results of CT abdomen pelvis, radiologist notes no acute intra-abdominal pathologies identified. [BM]  1105 Urine rapid drug screen (hosp performed)(!) Positive for cocaine and THC, likely contributing to presentation last night.  Benzodiazepine positive may be secondary to Ativan given earlier. [BM]  1106 Basic metabolic panel(!) BMP shows some improvement of creatinine at 1.61 down from 2.16.  Mild hypokalemia of 3.4.  Anion gap of 17 and bicarb of 20 similar to prior last night.  Discussed with Dr. Posey Rea will add on a beta hydroxybutyric acid and VBG. [BM]   1109 Urinalysis, Routine w reflex microscopic Urine, Clean Catch(!) Urinalysis shows 20 ketones suspect secondary to dehydration.  Many bacteria and 6-10 WBCs are present, leukocyte and nitrite negative.  Suspect dirty collection, low suspicion for UTI. [BM]  1220 Patient reassessed she is resting comfortably bed no acute distress.  She reports that she was concerned for possible staph infection of her hair last night which is her primary reason for presentation, she reports she gets staph infections around once a year and normally resolves after she shaves her head which she did yesterday.  She has a small linear abrasion to her scalp which patient reports occurred when she was shaving her head last night, no evidence of infection on my examination.  No evidence for significant head injury on my evaluation.  She reports she is feeling well at this time, she reports that she had 2 episodes of emesis few days ago which is why she believes she is dehydrated.  She reports has not used alcohol or cocaine since July 6.  Patient denies SI/HI, hallucinations or any additional concerns.  Patient currently fully alert and oriented. [BM]  1223 ED EKG Repeat EKG shows resolution of tachycardia.  No obvious acute ischemic changes on my interpretation. [BM]  1445 Family medicine, Dr. Ashok Cordia is coming to see the patient. [BM]    Clinical Course User Index [BM] Bill Salinas, PA-C   Medical Decision Making Amount and/or Complexity of Data Reviewed External Data Reviewed: notes.    Details: I reviewed outside notes, vaginal delivery December 2022. Labs: ordered. Decision-making details documented in ED Course.    Details: I personally reviewed and interpreted the following labs.  CMP shows elevated creatinine of 2.16, BUN 27.  Elevated anion gap of 17.  No emergent electrolyte derangement or LFT elevations.  Suspect AKI as cause of gap. Beta-hCG negative, no evidence for pregnancy. Ethanol negative. CBC  shows no leukocytosis of 12.1, no anemia or thrombocytopenia. Radiology: ordered. ECG/medicine tests: independent interpretation performed. Decision-making details documented in ED Course.    Details: I personally reviewed patient's EKG obtained at around 6 PM yesterday 02/02/2022.  Shows sinus tachycardia, I do not appreciate any obvious acute ischemic changes. --  Risk Prescription drug management. Decision regarding hospitalization.     Patient reassessed she is resting comfortably bed no acute distress.  Patient hydroxybutyric acid is elevated suspect this may be due to starvation ketoacidosis, she has no history of diabetes.  However given slight improvement of creatinine, elevated beta hydroxybutyric acid feel patient may benefit from further observation for additional IV fluids and recheck of BMP.  Case was discussed with attending physician Dr. Posey Rea who agrees with consultation to medicine service for admission.  I consulted with Dr. Ashok Cordia from family medicine, they are coming to see the patient for admission.  Patient was  updated on plan of care, she is agreeable.  She no additional complaints or concerns today, fully alert and oriented., no acute distress on reevaluation, nontender abdomen on reassessment ============== Addendum: Received phone call from family medicine advised that they feel patient will not need admission.  They plan to recheck a BMP and then leave a consult note for discharge.  Care handoff given to oncoming ER team Erica Conkin PA-C at shift change.  Plan of care is to await third creatinine and family medicine note.  Final disposition per oncoming team.  Note: Portions of this report may have been transcribed using voice recognition software. Every effort was made to ensure accuracy; however, inadvertent computerized transcription errors may still be present.     Bill Salinas, PA-C 02/03/22 1523    Bill Salinas, PA-C 02/03/22 1538    Glendora Score, MD 02/03/22 [phone]

## 2022-02-03 NOTE — ED Notes (Signed)
Pt able to ambulate to and from bathroom with a steady gait. No complaints made at this time.
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
## 2022-02-04 ENCOUNTER — Other Ambulatory Visit: Payer: Self-pay | Admitting: Student

## 2022-02-04 DIAGNOSIS — N179 Acute kidney failure, unspecified: Secondary | ICD-10-CM

## 2022-02-05 ENCOUNTER — Other Ambulatory Visit: Payer: Medicaid Other

## 2022-02-05 DIAGNOSIS — N179 Acute kidney failure, unspecified: Secondary | ICD-10-CM

## 2022-02-06 LAB — BASIC METABOLIC PANEL
BUN/Creatinine Ratio: 13 (ref 9–23)
BUN: 12 mg/dL (ref 6–20)
CO2: 23 mmol/L (ref 20–29)
Calcium: 9.8 mg/dL (ref 8.7–10.2)
Chloride: 104 mmol/L (ref 96–106)
Creatinine, Ser: 0.93 mg/dL (ref 0.57–1.00)
Glucose: 106 mg/dL — ABNORMAL HIGH (ref 70–99)
Potassium: 4.8 mmol/L (ref 3.5–5.2)
Sodium: 140 mmol/L (ref 134–144)
eGFR: 83 mL/min/{1.73_m2} (ref >59)

## 2022-02-09 ENCOUNTER — Ambulatory Visit: Payer: Medicaid Other | Admitting: Family Medicine

## 2022-03-03 ENCOUNTER — Ambulatory Visit: Payer: Medicaid Other | Admitting: Student

## 2022-06-24 ENCOUNTER — Ambulatory Visit (HOSPITAL_COMMUNITY)
Admission: RE | Admit: 2022-06-24 | Discharge: 2022-06-24 | Disposition: A | Discharge status: Home / Self Care | Payer: Medicaid Other | Source: Ambulatory Visit | Attending: Physician Assistant | Admitting: Physician Assistant

## 2022-06-24 ENCOUNTER — Encounter (HOSPITAL_COMMUNITY): Payer: Self-pay

## 2022-06-24 VITALS — BP 143/89 | HR 79 | Temp 99.1°F | Resp 16

## 2022-06-24 DIAGNOSIS — J0140 Acute pansinusitis, unspecified: Secondary | ICD-10-CM | POA: Diagnosis not present

## 2022-06-24 MED ORDER — AMOXICILLIN-POT CLAVULANATE 500-125 MG PO TABS: 1.0000 | ORAL_TABLET | Freq: Two times a day (BID) | ORAL | 0 refills | Status: DC

## 2022-06-24 MED ORDER — FLUTICASONE PROPIONATE 50 MCG/ACT NA SUSP: 1.0000 | Freq: Every day | NASAL | 0 refills | Status: DC
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
## 2022-06-24 NOTE — Discharge Instructions (Signed)
Start Augmentin twice daily for 7 days.  Continue your Mucinex and allergy medication.  Use Flonase to help with your congestion as well as sinus rinses.  Make sure you rest and drink plenty of fluid.  If your symptoms are improving within a few days please return for reevaluation.  If you have any worsening symptoms including worsening cough, chest pain, shortness of breath, fever, nausea/vomiting interfere with oral intake, weakness you need to be seen immediately.

## 2022-06-24 NOTE — ED Triage Notes (Signed)
Pt states nasal congestion and facial pressure for over a week, states she has been taking OTC meds with no relief.

## 2022-06-29 ENCOUNTER — Ambulatory Visit: Payer: Medicaid Other | Admitting: Student

## 2022-07-24 ENCOUNTER — Other Ambulatory Visit: Payer: Self-pay | Admitting: Certified Nurse Midwife

## 2022-07-24 DIAGNOSIS — Z3492 Encounter for supervision of normal pregnancy, unspecified, second trimester: Secondary | ICD-10-CM

## 2022-07-24 NOTE — Telephone Encounter (Signed)
Not currently pregnant

## 2022-07-28 ENCOUNTER — Encounter: Payer: Self-pay | Admitting: Family Medicine

## 2022-07-28 ENCOUNTER — Ambulatory Visit (INDEPENDENT_AMBULATORY_CARE_PROVIDER_SITE_OTHER): Payer: Medicaid Other | Admitting: Family Medicine

## 2022-07-28 VITALS — BP 144/92 | HR 67 | Temp 97.8°F | Ht 67.75 in | Wt 161.4 lb

## 2022-07-28 DIAGNOSIS — I10 Essential (primary) hypertension: Secondary | ICD-10-CM

## 2022-07-28 DIAGNOSIS — D649 Anemia, unspecified: Secondary | ICD-10-CM | POA: Insufficient documentation

## 2022-07-28 DIAGNOSIS — Z3042 Encounter for surveillance of injectable contraceptive: Secondary | ICD-10-CM | POA: Diagnosis not present

## 2022-07-28 DIAGNOSIS — F1290 Cannabis use, unspecified, uncomplicated: Secondary | ICD-10-CM | POA: Insufficient documentation

## 2022-07-28 DIAGNOSIS — N179 Acute kidney failure, unspecified: Secondary | ICD-10-CM

## 2022-07-28 DIAGNOSIS — J3089 Other allergic rhinitis: Secondary | ICD-10-CM

## 2022-07-28 DIAGNOSIS — L219 Seborrheic dermatitis, unspecified: Secondary | ICD-10-CM | POA: Insufficient documentation

## 2022-07-28 LAB — CBC
HCT: 30.2 % — ABNORMAL LOW (ref 36.0–46.0)
Hemoglobin: 9.8 g/dL — ABNORMAL LOW (ref 12.0–15.0)
MCHC: 32.2 g/dL (ref 30.0–36.0)
MCV: 84.2 fl (ref 78.0–100.0)
Platelets: 348 10*3/uL (ref 150.0–400.0)
RBC: 3.59 Mil/uL — ABNORMAL LOW (ref 3.87–5.11)
RDW: 16.8 % — ABNORMAL HIGH (ref 11.5–15.5)
WBC: 9.7 10*3/uL (ref 4.0–10.5)

## 2022-07-28 LAB — COMPREHENSIVE METABOLIC PANEL
ALT: 6 U/L (ref 0–35)
AST: 17 U/L (ref 0–37)
Albumin: 4.4 g/dL (ref 3.5–5.2)
Alkaline Phosphatase: 63 U/L (ref 39–117)
BUN: 13 mg/dL (ref 6–23)
CO2: 25 mEq/L (ref 19–32)
Calcium: 9.4 mg/dL (ref 8.4–10.5)
Chloride: 105 mEq/L (ref 96–112)
Creatinine, Ser: 0.78 mg/dL (ref 0.40–1.20)
GFR: 99.21 mL/min (ref >60.00)
Glucose, Bld: 80 mg/dL (ref 70–99)
Potassium: 4 mEq/L (ref 3.5–5.1)
Sodium: 140 mEq/L (ref 135–145)
Total Bilirubin: 0.8 mg/dL (ref 0.2–1.2)
Total Protein: 7.1 g/dL (ref 6.0–8.3)

## 2022-07-28 LAB — URINALYSIS, ROUTINE W REFLEX MICROSCOPIC
Bilirubin Urine: NEGATIVE
Ketones, ur: NEGATIVE
Leukocytes,Ua: NEGATIVE
Nitrite: NEGATIVE
Specific Gravity, Urine: 1.025 (ref 1.000–1.030)
Total Protein, Urine: NEGATIVE
Urine Glucose: NEGATIVE
Urobilinogen, UA: 0.2 (ref 0.0–1.0)
pH: 6.5 (ref 5.0–8.0)

## 2022-07-28 LAB — MICROALBUMIN / CREATININE URINE RATIO
Creatinine,U: 206.3 mg/dL
Microalb Creat Ratio: 0.6 mg/g (ref 0.0–30.0)
Microalb, Ur: 1.3 mg/dL (ref 0.0–1.9)

## 2022-07-28 LAB — POCT URINE PREGNANCY: Preg Test, Ur: NEGATIVE

## 2022-07-28 LAB — T4, FREE: Free T4: 0.68 ng/dL (ref 0.60–1.60)

## 2022-07-28 LAB — TSH: TSH: 0.74 u[IU]/mL (ref 0.35–5.50)

## 2022-07-28 MED ORDER — AMLODIPINE BESYLATE 5 MG PO TABS: 5.0000 mg | ORAL_TABLET | Freq: Every day | ORAL | 11 refills | Status: DC

## 2022-07-28 MED ORDER — MEDROXYPROGESTERONE ACETATE 150 MG/ML IM SUSP
150.0000 mg | Freq: Once | INTRAMUSCULAR | Status: AC
  Administered 2022-07-28: 150 mg via INTRAMUSCULAR
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
## 2022-08-11 ENCOUNTER — Telehealth: Payer: Self-pay | Admitting: Family Medicine

## 2022-08-11 ENCOUNTER — Ambulatory Visit: Payer: Medicaid Other | Admitting: Family Medicine

## 2022-08-11 NOTE — Telephone Encounter (Signed)
Pt called at 9:15a that she needed to RS her appt - 1st late cancellation/no show, fee waived, letter sent

## 2022-08-11 NOTE — Telephone Encounter (Signed)
1..23.24 no show letter sent 

## 2022-08-24 ENCOUNTER — Telehealth: Payer: Self-pay | Admitting: Family Medicine

## 2022-08-24 ENCOUNTER — Ambulatory Visit: Payer: Medicaid Other | Admitting: Family Medicine

## 2022-08-24 NOTE — Telephone Encounter (Signed)
2nd no show, final warning letter sent via mail and mychart to reschedule & notify of no show policy, cannot bill fee due to Medicaid ins

## 2022-08-24 NOTE — Telephone Encounter (Signed)
2.5.24 no show letter sent 

## 2023-03-25 ENCOUNTER — Encounter (HOSPITAL_COMMUNITY): Payer: Self-pay | Admitting: Obstetrics and Gynecology

## 2023-03-25 ENCOUNTER — Inpatient Hospital Stay (HOSPITAL_COMMUNITY): Payer: Medicaid Other

## 2023-03-25 ENCOUNTER — Other Ambulatory Visit: Payer: Self-pay

## 2023-03-25 ENCOUNTER — Inpatient Hospital Stay (HOSPITAL_COMMUNITY)
Admission: AD | Admit: 2023-03-25 | Discharge: 2023-03-25 | Disposition: A | Discharge status: Home / Self Care | Payer: Medicaid Other | Attending: Obstetrics and Gynecology | Admitting: Obstetrics and Gynecology

## 2023-03-25 DIAGNOSIS — E860 Dehydration: Secondary | ICD-10-CM | POA: Diagnosis not present

## 2023-03-25 DIAGNOSIS — O99281 Endocrine, nutritional and metabolic diseases complicating pregnancy, first trimester: Secondary | ICD-10-CM | POA: Diagnosis not present

## 2023-03-25 DIAGNOSIS — O3680X0 Pregnancy with inconclusive fetal viability, not applicable or unspecified: Secondary | ICD-10-CM | POA: Diagnosis not present

## 2023-03-25 DIAGNOSIS — R102 Pelvic and perineal pain: Secondary | ICD-10-CM | POA: Diagnosis not present

## 2023-03-25 DIAGNOSIS — O26891 Other specified pregnancy related conditions, first trimester: Secondary | ICD-10-CM

## 2023-03-25 DIAGNOSIS — Z3A01 Less than 8 weeks gestation of pregnancy: Secondary | ICD-10-CM | POA: Diagnosis not present

## 2023-03-25 DIAGNOSIS — R112 Nausea with vomiting, unspecified: Secondary | ICD-10-CM | POA: Diagnosis present

## 2023-03-25 DIAGNOSIS — O219 Vomiting of pregnancy, unspecified: Secondary | ICD-10-CM | POA: Diagnosis not present

## 2023-03-25 LAB — COMPREHENSIVE METABOLIC PANEL
ALT: 10 U/L (ref 0–44)
AST: 23 U/L (ref 15–41)
Albumin: 4.4 g/dL (ref 3.5–5.0)
Alkaline Phosphatase: 50 U/L (ref 38–126)
Anion gap: 17 — ABNORMAL HIGH (ref 5–15)
BUN: 11 mg/dL (ref 6–20)
CO2: 20 mmol/L — ABNORMAL LOW (ref 22–32)
Calcium: 10 mg/dL (ref 8.9–10.3)
Chloride: 99 mmol/L (ref 98–111)
Creatinine, Ser: 0.79 mg/dL (ref 0.44–1.00)
GFR, Estimated: >60 mL/min (ref >60)
Glucose, Bld: 126 mg/dL — ABNORMAL HIGH (ref 70–99)
Potassium: 3.6 mmol/L (ref 3.5–5.1)
Sodium: 136 mmol/L (ref 135–145)
Total Bilirubin: 1.1 mg/dL (ref 0.3–1.2)
Total Protein: 8 g/dL (ref 6.5–8.1)

## 2023-03-25 LAB — CBC
HCT: 41.4 % (ref 36.0–46.0)
Hemoglobin: 13.9 g/dL (ref 12.0–15.0)
MCH: 28.3 pg (ref 26.0–34.0)
MCHC: 33.6 g/dL (ref 30.0–36.0)
MCV: 84.3 fL (ref 80.0–100.0)
Platelets: 447 10*3/uL — ABNORMAL HIGH (ref 150–400)
RBC: 4.91 MIL/uL (ref 3.87–5.11)
RDW: 15.3 % (ref 11.5–15.5)
WBC: 12.1 10*3/uL — ABNORMAL HIGH (ref 4.0–10.5)
nRBC: 0 % (ref 0.0–0.2)

## 2023-03-25 LAB — HCG, SERUM, QUALITATIVE: Preg, Serum: POSITIVE — AB

## 2023-03-25 LAB — HCG, QUANTITATIVE, PREGNANCY: hCG, Beta Chain, Quant, S: 5849 m[IU]/mL — ABNORMAL HIGH (ref <5)

## 2023-03-25 LAB — LIPASE, BLOOD: Lipase: 24 U/L (ref 11–51)

## 2023-03-25 MED ORDER — ONDANSETRON HCL 4 MG/2ML IJ SOLN
4.0000 mg | Freq: Once | INTRAMUSCULAR | Status: AC
  Administered 2023-03-25: 4 mg via INTRAVENOUS
  Filled 2023-03-25: qty 2

## 2023-03-25 MED ORDER — ONDANSETRON 4 MG PO TBDP: 4.0000 mg | ORAL_TABLET | Freq: Four times a day (QID) | ORAL | 0 refills | Status: DC | PRN

## 2023-03-25 MED ORDER — LACTATED RINGERS IV SOLN: Freq: Once | INTRAVENOUS | Status: AC

## 2023-03-25 MED ORDER — SODIUM CHLORIDE 0.9 % IV SOLN
12.5000 mg | Freq: Once | INTRAVENOUS | Status: DC
  Filled 2023-03-25: qty 0.5

## 2023-03-25 MED ORDER — PROMETHAZINE HCL 25 MG PO TABS: 25.0000 mg | ORAL_TABLET | Freq: Four times a day (QID) | ORAL | 2 refills | Status: DC | PRN

## 2023-03-25 MED ORDER — ONDANSETRON 4 MG PO TBDP
4.0000 mg | ORAL_TABLET | Freq: Once | ORAL | Status: AC | PRN
  Administered 2023-03-25: 4 mg via ORAL
  Filled 2023-03-25: qty 1

## 2023-03-25 NOTE — ED Provider Notes (Signed)
[place] EMERGENCY DEPARTMENT AT [facility] Provider Note   CSN: [id number] Arrival date & time: 03/25/23  0051     History  Chief Complaint  Patient presents with   Emesis   Abdominal Pain    Tracy Black is a 37 y.o. female.  Patient presents to the emergency department complaining of abdominal pain with nausea and vomiting which began at 6 PM. She denies fever, chest pain, shortness of breath. Past medical history significant for hypertension   Emesis Associated symptoms: abdominal pain   Abdominal Pain Associated symptoms: vomiting        Home Medications Prior to Admission medications   Medication Sig Start Date End Date Taking? Authorizing Provider  amLODipine (NORVASC) 5 MG tablet Take 1 tablet (5 mg total) by mouth daily. 07/28/22   Loyola Mast, MD  Blood Pressure Monitoring (BLOOD PRESSURE KIT) DEVI 1 each by Does not apply route once a week. 01/29/21   Bernerd Limbo, CNM  cetirizine (ZYRTEC) 10 MG tablet Take 1 tablet (10 mg total) by mouth daily. 01/29/21   Bernerd Limbo, CNM  fluticasone (FLONASE) 50 MCG/ACT nasal spray Place 1 spray into both nostrils daily. 06/24/22   Raspet, Noberto Retort, PA-C      Allergies    Lisinopril and Lisinopril    Review of Systems   Review of Systems  Gastrointestinal:  Positive for abdominal pain and vomiting.    Physical Exam Updated Vital Signs BP (!) 125/106 (BP Location: Right Arm)   Pulse 90   Temp 97.9 F (36.6 C)   Resp 16   SpO2 100%  Physical Exam Vitals and nursing note reviewed.  Constitutional:      General: She is not in acute distress.    Appearance: She is well-developed.  HENT:     Head: Normocephalic and atraumatic.  Eyes:     Conjunctiva/sclera: Conjunctivae normal.  Cardiovascular:     Rate and Rhythm: Normal rate and regular rhythm.     Heart sounds: No murmur heard. Pulmonary:     Effort: Pulmonary effort is normal. No respiratory distress.     Breath sounds: Normal breath  sounds.  Abdominal:     Palpations: Abdomen is soft.     Tenderness: There is generalized abdominal tenderness.  Musculoskeletal:        General: No swelling.     Cervical back: Neck supple.  Skin:    General: Skin is warm and dry.     Capillary Refill: Capillary refill takes less than 2 seconds.  Neurological:     Mental Status: She is alert.  Psychiatric:        Mood and Affect: Mood normal.     ED Results / Procedures / Treatments   Labs (all labs ordered are listed, but only abnormal results are displayed) Labs Reviewed  COMPREHENSIVE METABOLIC PANEL - Abnormal; Notable for the following components:      Result Value   CO2 20 (*)    Glucose, Bld 126 (*)    Anion gap 17 (*)    All other components within normal limits  CBC - Abnormal; Notable for the following components:   WBC 12.1 (*)    Platelets 447 (*)    All other components within normal limits  HCG, SERUM, QUALITATIVE - Abnormal; Notable for the following components:   Preg, Serum POSITIVE (*)    All other components within normal limits  LIPASE, BLOOD  URINALYSIS, ROUTINE W REFLEX MICROSCOPIC  HCG, QUANTITATIVE,  PREGNANCY    EKG None  Radiology No results found.  Procedures Procedures    Medications Ordered in ED Medications  ondansetron (ZOFRAN-ODT) disintegrating tablet 4 mg (4 mg Oral Given 03/25/23 0059)  ondansetron (ZOFRAN) injection 4 mg (4 mg Intravenous Given 03/25/23 0203)    ED Course/ Medical Decision Making/ A&P                                 Medical Decision Making Amount and/or Complexity of Data Reviewed Labs: ordered.  Risk Prescription drug management.   Patient with nausea and vomiting, labs showed positive pregnancy test. Discussed with MAU APP who agreed with patient transfer to MAU.         Final Clinical Impression(s) / ED Diagnoses Final diagnoses:  Nausea and vomiting, unspecified vomiting type    Rx / DC Orders ED Discharge Orders     None          Pamala Duffel 03/25/23 0254    Palumbo, April, MD 03/25/23 [phone]

## 2023-03-25 NOTE — ED Notes (Signed)
Report given to MAU 

## 2023-03-25 NOTE — MAU Provider Note (Signed)
Chief Complaint: Emesis and Abdominal Pain   Event Date/Time   First Provider Initiated Contact with Patient 03/25/23 0318        SUBJECTIVE HPI: Tracy Black is a 37 y.o. [id number] at Unknown by LMP who presents to maternity admissions reporting sudden onset of vomiting at 11pm.  Also has some lower abdominal cramping.  Did not know she was pregnant. Did not plan pregnancy but was not on contraception. She denies vaginal bleeding, vaginal itching/burning, urinary symptoms, h/a, dizziness, n/v, or fever/chills.     Emesis  This is a new problem. The current episode started today. The problem occurs less than 2 times per day. There has been no fever. Associated symptoms include abdominal pain. Pertinent negatives include no chills or diarrhea. She has tried nothing for the symptoms.  Abdominal Pain This is a new problem. The current episode started today. The pain is located in the suprapubic region. The quality of the pain is cramping and aching. The abdominal pain does not radiate. Associated symptoms include vomiting. Pertinent negatives include no constipation, diarrhea or dysuria. Nothing aggravates the pain. The pain is relieved by Nothing. She has tried nothing for the symptoms.   RN Note: .Tracy Black is a 37 y.o. at Unknown here in MAU from [facility] reporting: nausea vomiting, began at 11 pm, medication that was given in the ED helped.   Reports lower abdominal cramping.  LMP: 02/22/2023  Past Medical History:  Diagnosis Date   Angio-edema 04/22/2019   Chronic hypertension 04/12/2021   Essential hypertension 04/22/2019   Gestational hypertension    Positive RPR test 02/05/2021   Past Surgical History:  Procedure Laterality Date   WISDOM TOOTH EXTRACTION     Social History   Socioeconomic History   Marital status: Single    Spouse name: Not on file   Number of children: 3   Years of education: Not on file   Highest education level: Not on file  Occupational History   Not on  file  Tobacco Use   Smoking status: Never   Smokeless tobacco: Never  Vaping Use   Vaping status: Never Used  Substance and Sexual Activity   Alcohol use: Yes    Alcohol/week: 8.0 standard drinks of alcohol    Types: 8 Glasses of wine per week   Drug use: Yes    Types: Marijuana, Cocaine    Comment: patient reports consumed two CBD products   Sexual activity: Not Currently    Birth control/protection: None  Other Topics Concern   Not on file  Social History Narrative       Social Determinants of Health   Financial Resource Strain: Not on file  Food Insecurity: Not on file  Transportation Needs: Not on file  Physical Activity: Not on file  Stress: Not on file  Social Connections: Not on file  Intimate Partner Violence: Not on file   No current facility-administered medications on file prior to encounter.   Current Outpatient Medications on File Prior to Encounter  Medication Sig Dispense Refill   amLODipine (NORVASC) 5 MG tablet Take 1 tablet (5 mg total) by mouth daily. 30 tablet 11   Blood Pressure Monitoring (BLOOD PRESSURE KIT) DEVI 1 each by Does not apply route once a week. 1 each 0   cetirizine (ZYRTEC) 10 MG tablet Take 1 tablet (10 mg total) by mouth daily. 30 tablet 5   fluticasone (FLONASE) 50 MCG/ACT nasal spray Place 1 spray into both nostrils daily. 16 g 0  Allergies  Allergen Reactions   Lisinopril Swelling    Angioedema   Lisinopril Anaphylaxis    I have reviewed patient's Past Medical Hx, Surgical Hx, Family Hx, Social Hx, medications and allergies.   ROS:  Review of Systems  Constitutional:  Negative for chills.  Gastrointestinal:  Positive for abdominal pain and vomiting. Negative for constipation and diarrhea.  Genitourinary:  Negative for dysuria.   Review of Systems  Other systems negative   Physical Exam  Physical Exam Patient Vitals for the past 24 hrs:  BP Temp Pulse Resp SpO2  03/25/23 0054 (!) 125/106 97.9 F (36.6 C) 90 16 100  %   Constitutional: Well-developed, well-nourished female in no acute distress.  Cardiovascular: normal rate Respiratory: normal effort GI: Abd soft, non-tender.  MS: Extremities nontender, no edema, normal ROM Neurologic: Alert and oriented x 4.  GU: Neg CVAT.  PELVIC EXAM: deferred in lieu of ultrasound  LAB RESULTS Results for orders placed or performed during the hospital encounter of 03/25/23 (from the past 24 hour(s))  Lipase, blood     Status: None   Collection Time: 03/25/23  1:00 AM  Result Value Ref Range   Lipase 24 11 - 51 U/L  Comprehensive metabolic panel     Status: Abnormal   Collection Time: 03/25/23  1:00 AM  Result Value Ref Range   Sodium 136 135 - 145 mmol/L   Potassium 3.6 3.5 - 5.1 mmol/L   Chloride 99 98 - 111 mmol/L   CO2 20 (L) 22 - 32 mmol/L   Glucose, Bld 126 (H) 70 - 99 mg/dL   BUN 11 6 - 20 mg/dL   Creatinine, Ser 4.09 0.44 - 1.00 mg/dL   Calcium 81.1 8.9 - 91.4 mg/dL   Total Protein 8.0 6.5 - 8.1 g/dL   Albumin 4.4 3.5 - 5.0 g/dL   AST 23 15 - 41 U/L   ALT 10 0 - 44 U/L   Alkaline Phosphatase 50 38 - 126 U/L   Total Bilirubin 1.1 0.3 - 1.2 mg/dL   GFR, Estimated >78 >29 mL/min   Anion gap 17 (H) 5 - 15  CBC     Status: Abnormal   Collection Time: 03/25/23  1:00 AM  Result Value Ref Range   WBC 12.1 (H) 4.0 - 10.5 K/uL   RBC 4.91 3.87 - 5.11 MIL/uL   Hemoglobin 13.9 12.0 - 15.0 g/dL   HCT 56.2 13.0 - 86.5 %   MCV 84.3 80.0 - 100.0 fL   MCH 28.3 26.0 - 34.0 pg   MCHC 33.6 30.0 - 36.0 g/dL   RDW 78.4 69.6 - 29.5 %   Platelets 447 (H) 150 - 400 K/uL   nRBC 0.0 0.0 - 0.2 %  hCG, serum, qualitative     Status: Abnormal   Collection Time: 03/25/23  1:00 AM  Result Value Ref Range   Preg, Serum POSITIVE (A) NEGATIVE  hCG, quantitative, pregnancy     Status: Abnormal   Collection Time: 03/25/23  1:00 AM  Result Value Ref Range   hCG, Beta Chain, Quant, S 5,849 (H) <5 mIU/mL     IMAGING US OB LESS THAN 14 WEEKS WITH OB  TRANSVAGINAL  Result Date: 03/25/2023 CLINICAL DATA:  37 year old female with pain in the 1st trimester of pregnancy. Quantitative beta HCG 5,849. Estimated gestational age by LMP 4 weeks and 3 days. EXAM: OBSTETRIC <14 WK [place] AND TRANSVAGINAL OB US TECHNIQUE: Both transabdominal and transvaginal ultrasound examinations were performed for complete evaluation  of the gestation as well as the maternal uterus, adnexal regions, and pelvic cul-de-sac. Transvaginal technique was performed to assess early pregnancy. COMPARISON:  None applicable Ob ultrasound. Previous CT Abdomen and Pelvis 02/03/2022. FINDINGS: Intrauterine gestational sac: Single Yolk sac:  Visible, measures 3 mm Embryo:  Not visible Cardiac Activity: Negative, not applicable MSD: 7.0 mm   5 w   3 d Subchorionic hemorrhage:  None visualized. Maternal uterus/adnexae: The right ovary is normal measuring 3.2 x 2.0 x 1.8 cm. The left ovary appears contain the corpus luteum, is normal measuring 2.7 x 3.5 x 2.4 cm. No suspicious adnexal finding. No pelvis free fluid. IMPRESSION: Probable early intrauterine gestational sac with yolk sac, but no fetal pole, or cardiac activity yet visualized. Recommend follow-up quantitative B-HCG levels and follow-up US in 14 days to assess viability. This recommendation follows SRU consensus guidelines: Diagnostic Criteria for Nonviable Pregnancy Early in the First Trimester. Malva Limes Med 2013; 716:9678-93. Electronically Signed   By: Odessa Fleming M.D.   On: 03/25/2023 04:24     MAU Management/MDM: I have reviewed the triage vital signs and the nursing notes.   Pertinent labs & imaging results that were available during my care of the patient were reviewed by me and considered in my medical decision making (see chart for details).      I have reviewed her medical records including past results, notes and treatments. Medical, Surgical, and family history were reviewed.  Medications and recent lab tests were reviewed  Ordered  usual first trimester r/o ectopic labs.   Will check baseline Ultrasound to rule out ectopic. Treatments in MAU included LR infusion for rehydration.  We gave Zofran with good relief of nausea. .   This bleeding/pain can represent a normal pregnancy with bleeding, spontaneous abortion or even an ectopic which can be life-threatening.  The process as listed above helps to determine which of these is present.    ASSESSMENT 1. Nausea and vomiting, unspecified vomiting type   2.     Pregnancy at [redacted]w[redacted]d 3..    Pregnancy of unknown location, now known IUP 4.     Pelvic pain 5.     Mild dehydration  PLAN Discharge home Will repeat  Ultrasound in about 7-10 days   Rx Zofran for use during day Rx Phenergan for use at night Message sent to arrange new OB visit  Pt stable at time of discharge. Encouraged to return here if she develops worsening of symptoms, increase in pain, fever, or other concerning symptoms.    Wynelle Bourgeois CNM, MSN Certified Nurse-Midwife 03/25/2023  3:18 AM

## 2023-03-25 NOTE — ED Triage Notes (Signed)
Patient reports pain across abdomen with emesis this evening , she suspects food poisoning , no diarrhea or fever .

## 2023-03-25 NOTE — MAU Note (Signed)
..  Tracy Black is a 37 y.o. at Unknown here in MAU from [facility] reporting: nausea vomiting, began at 11 pm, medication that was given in the ED helped.   Reports lower abdominal cramping.  LMP: 02/22/2023  Pain score: 7/10 Vitals:   03/25/23 0054  BP: (!) 125/106  Pulse: 90  Resp: 16  Temp: 97.9 F (36.6 C)  SpO2: 100%     FHT:n/a Lab orders placed from triage:  UA (unable to void)

## 2023-06-10 ENCOUNTER — Other Ambulatory Visit: Payer: Self-pay

## 2023-06-10 ENCOUNTER — Telehealth: Payer: Self-pay | Admitting: *Deleted

## 2023-06-10 ENCOUNTER — Encounter: Payer: Self-pay | Admitting: *Deleted

## 2023-06-10 ENCOUNTER — Ambulatory Visit: Payer: Medicaid Other | Admitting: *Deleted

## 2023-06-10 ENCOUNTER — Ambulatory Visit: Payer: Medicaid Other

## 2023-06-10 VITALS — BP 138/84 | HR 75 | Wt 177.2 lb

## 2023-06-10 DIAGNOSIS — Z1332 Encounter for screening for maternal depression: Secondary | ICD-10-CM | POA: Diagnosis not present

## 2023-06-10 DIAGNOSIS — Z3491 Encounter for supervision of normal pregnancy, unspecified, first trimester: Secondary | ICD-10-CM

## 2023-06-10 DIAGNOSIS — O09529 Supervision of elderly multigravida, unspecified trimester: Secondary | ICD-10-CM | POA: Insufficient documentation

## 2023-06-10 DIAGNOSIS — O3680X0 Pregnancy with inconclusive fetal viability, not applicable or unspecified: Secondary | ICD-10-CM

## 2023-06-10 DIAGNOSIS — Z3A16 16 weeks gestation of pregnancy: Secondary | ICD-10-CM

## 2023-06-10 DIAGNOSIS — O09522 Supervision of elderly multigravida, second trimester: Secondary | ICD-10-CM

## 2023-06-10 DIAGNOSIS — O169 Unspecified maternal hypertension, unspecified trimester: Secondary | ICD-10-CM | POA: Insufficient documentation

## 2023-06-10 DIAGNOSIS — Z3492 Encounter for supervision of normal pregnancy, unspecified, second trimester: Secondary | ICD-10-CM

## 2023-06-10 DIAGNOSIS — O09899 Supervision of other high risk pregnancies, unspecified trimester: Secondary | ICD-10-CM | POA: Insufficient documentation

## 2023-06-10 DIAGNOSIS — O09299 Supervision of pregnancy with other poor reproductive or obstetric history, unspecified trimester: Secondary | ICD-10-CM | POA: Insufficient documentation

## 2023-06-10 DIAGNOSIS — O0990 Supervision of high risk pregnancy, unspecified, unspecified trimester: Secondary | ICD-10-CM | POA: Insufficient documentation

## 2023-06-10 MED ORDER — GOJJI WEIGHT SCALE MISC
1.0000 | 0 refills | Status: DC | PRN
Start: 2023-06-10 — End: 2023-10-28

## 2023-06-10 MED ORDER — BLOOD PRESSURE KIT DEVI
1.0000 | 0 refills | Status: DC | PRN
Start: 2023-06-10 — End: 2023-10-28

## 2023-06-10 NOTE — Progress Notes (Signed)
New OB Intake  Patient came to office in person for intake visit.   I discussed the limitations, risks, security and privacy concerns of performing an evaluation and management service by telephone and the availability of in person appointments. I also discussed with the patient that there may be a patient responsible charge related to this service. The patient expressed understanding and agreed to proceed.  I explained I am completing New OB Intake today. We discussed EDD of 11/21/2023, by [place] completed today. She was supposed to have viability [place] in October. FHR today 145. Dating [place] completed today.  She confirms she does not know when her LMP was.  Pt is [id number]. I reviewed her allergies, medications and Medical/Surgical/OB history.   She would like Zofran renewed, will forward to provider for approval.   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 Discussed the nature of our practice with multiple providers including residents and students. Due to the size of the practice, the delivering provider may not be the same as those providing prenatal care.   Patient is interested in water birth.; but we discussed having CHTN on meds disqualifies her.   MyChart/Babyscripts MyChart access verified. I explained pt will have some visits in office and some virtually. Babyscripts  instructions given and order placed.   Blood Pressure Cuff/Weight Scale Blood pressure cuff ordered for patient to pick-up from [employer]. Explained after first prenatal appt pt will check weekly and document in Babyscripts. Patient does not have weight scale; order sent to [facility], patient may track weight weekly in Babyscripts.  Anatomy US Explained first scheduled [place] will be around 19 weeks.and will be scheduled and she will see appointment in MyChart.  Anatomy US scheduled for 07/05/23 at 0715.  Is patient a CenteringPregnancy candidate?  Declined Declined due to Group setting   Is patient a Mom+Baby Combined Care candidate?  Not a candidate    Interested in [place]?  yes, send referral and doula dot phrase.   Is patient a candidate for Babyscripts Optimization? No - CHTN  First visit review I reviewed new OB appt with patient. Explained pt will be seen by Dr. Donavan Foil at first visit. Discussed Avelina Laine genetic screening with patient. She would like  Panorama drawn today with routine prenatal labs.   Last Pap Diagnosis  Date Value Ref Range Status  01/29/2021   Final   - Negative for intraepithelial lesion or malignancy (NILM)   Pregnancy risk form completed.    Nancy Fetter 06/10/2023  1:29 PM

## 2023-06-10 NOTE — Telephone Encounter (Signed)
Patient has virtual new ob intake scheduled. Per chart review had [place] in MAU that did not confirm live pregnancy. Per protocol should have had repeat viability [place] before intake appointment. Since she should be 15 weeks I called and asked if she can come in person instead for Intake and then I can doppler baby and verify pregnancy. She states she can come in to office today. Nancy Fetter

## 2023-06-10 NOTE — Patient Instructions (Signed)
Options for Doula Care in the [place]  As you review your birthing options, consider having a birth doula. A doula is trained to provide support before, during and just after you give birth. There are also postpartum doulas that help you adjust to new parenthood.  While doulas do not provide medical care, they do provide emotional, physical and educational support. A few months before your baby arrives, doulas can help answer questions, ease concerns and help you create and support your birthing plan.    Doulas can help reduce your stress and comfort you and your partner. They can help you cope with labor by helping you use breathing techniques, massage, creative labor positioning, essential oils and affirmations.   Studies show that the benefits of having a doula include:   A more positive birth experience  Fewer requests for pain-relief medication  Less likelihood of cesarean section, commonly called a c-section   Doulas are typically hired via a fee and contract between you and the doula. We are happy to provide a list of the most active doulas in the area, all of whom are credentialed by [facility] and will not count as a visitor at your birth.  There are several options for no-cost doula care at our hospital, including:  [facility] Volunteer Doula Program Every Baby [place] Doula Program A Cure 4 Moms Doula Study (available only at [facility], [facility], [place] and [place] [facility] offices)  For more information on these programs or to receive a list of doulas active in our area, please email [email or web address]         

## 2023-06-11 LAB — CBC/D/PLT+RPR+RH+ABO+RUBIGG...
Antibody Screen: NEGATIVE
Basophils Absolute: 0.1 10*3/uL (ref 0.0–0.2)
Basos: 0 %
EOS (ABSOLUTE): 0.2 10*3/uL (ref 0.0–0.4)
Eos: 1 %
HCV Ab: NONREACTIVE
HIV Screen 4th Generation wRfx: NONREACTIVE
Hematocrit: 34.3 % (ref 34.0–46.6)
Hemoglobin: 11.2 g/dL (ref 11.1–15.9)
Hepatitis B Surface Ag: NEGATIVE
Immature Grans (Abs): 0.1 10*3/uL (ref 0.0–0.1)
Immature Granulocytes: 1 %
Lymphocytes Absolute: 3 10*3/uL (ref 0.7–3.1)
Lymphs: 26 %
MCH: 30.4 pg (ref 26.6–33.0)
MCHC: 32.7 g/dL (ref 31.5–35.7)
MCV: 93 fL (ref 79–97)
Monocytes Absolute: 0.7 10*3/uL (ref 0.1–0.9)
Monocytes: 6 %
Neutrophils Absolute: 7.4 10*3/uL — ABNORMAL HIGH (ref 1.4–7.0)
Neutrophils: 66 %
Platelets: 324 10*3/uL (ref 150–450)
RBC: 3.68 x10E6/uL — ABNORMAL LOW (ref 3.77–5.28)
RDW: 15.1 % (ref 11.7–15.4)
RPR Ser Ql: NONREACTIVE
Rh Factor: POSITIVE
Rubella Antibodies, IGG: 3.12 {index} (ref >0.99)
WBC: 11.4 10*3/uL — ABNORMAL HIGH (ref 3.4–10.8)

## 2023-06-11 LAB — HEMOGLOBIN A1C
Est. average glucose Bld gHb Est-mCnc: 111 mg/dL
Hgb A1c MFr Bld: 5.5 % (ref 4.8–5.6)

## 2023-06-11 LAB — HCV INTERPRETATION

## 2023-06-12 LAB — URINE CULTURE, OB REFLEX

## 2023-06-12 LAB — CULTURE, OB URINE

## 2023-06-14 ENCOUNTER — Encounter: Payer: Self-pay | Admitting: *Deleted

## 2023-06-14 ENCOUNTER — Other Ambulatory Visit: Payer: Self-pay | Admitting: *Deleted

## 2023-06-14 DIAGNOSIS — O219 Vomiting of pregnancy, unspecified: Secondary | ICD-10-CM

## 2023-06-14 MED ORDER — ONDANSETRON 4 MG PO TBDP: 4.0000 mg | ORAL_TABLET | Freq: Four times a day (QID) | ORAL | 0 refills | Status: DC | PRN

## 2023-06-14 NOTE — Progress Notes (Signed)
Received approval for Zofran from Dr. Donavan Foil. RX sent in to [facility]. Nancy Fetter

## 2023-06-15 NOTE — Progress Notes (Signed)
Patient was assessed and managed by nursing staff during this encounter. I have reviewed the chart and agree with the documentation and plan. I have also made any necessary editorial changes.  Warden Fillers, MD 06/15/2023 9:46 AM

## 2023-06-17 LAB — PANORAMA PRENATAL TEST FULL PANEL:PANORAMA TEST PLUS 5 ADDITIONAL MICRODELETIONS: FETAL FRACTION: 6.6

## 2023-06-21 ENCOUNTER — Encounter: Payer: Self-pay | Admitting: Obstetrics and Gynecology

## 2023-06-24 ENCOUNTER — Encounter: Payer: Medicaid Other | Admitting: Certified Nurse Midwife

## 2023-07-05 ENCOUNTER — Ambulatory Visit: Payer: Medicaid Other

## 2023-07-05 ENCOUNTER — Ambulatory Visit: Payer: Medicaid Other | Attending: Obstetrics and Gynecology

## 2023-07-15 NOTE — Progress Notes (Deleted)
History:   Tracy Black is a 37 y.o. [id number] at [redacted]w[redacted]d by midtrimester ultrasound being seen today for her first obstetrical visit.   Patient does intend to breast feed.   Pregnancy history fully reviewed. Obstetrical history is significant for 3 FT SVD. Her first was complicated by preE and her last was complicated by [facility]. She has has no complications with deliveries.   Patient reports {sx:14538}.      HISTORY: OB History  Gravida Para Term Preterm AB Living  6 3 3  0 2 3  SAB IAB Ectopic Multiple Live Births  1 0 0 0 3    # Outcome Date GA Lbr Len/2nd Weight Sex Type Anes PTL Lv  6 Current           5 Term 06/30/21 [redacted]w[redacted]d / 00:07 7 lb 2.6 oz (3.25 kg) M Vag-Spont None  LIV     Birth Comments: CHTN     Name: Black,BOY Tracy     Apgar1: 9  Apgar5: 9  4 SAB 2019 [redacted]w[redacted]d            Birth Comments: had to have D&C  3 AB 2018          2 Term 08/19/13 [redacted]w[redacted]d  6 lb 6 oz (2.892 kg) M Vag-Spont None  LIV     Birth Comments: precipitous delivery- almost delivered at home, had late [facility]  1 Term 2012 [redacted]w[redacted]d  6 lb 12 oz (3.062 kg) M Vag-Spont Other  LIV     Birth Comments: Pre-ecclampsia     Lab Results  Component Value Date   DIAGPAP  01/29/2021    - Negative for intraepithelial lesion or malignancy (NILM)   HPVHIGH Negative 01/29/2021     Past Medical History:  Diagnosis Date   Angio-edema 04/22/2019   Chronic hypertension 04/12/2021   Essential hypertension 04/22/2019   Gestational hypertension    Positive RPR test 02/05/2021   Past Surgical History:  Procedure Laterality Date   WISDOM TOOTH EXTRACTION     Family History  Problem Relation Age of Onset   Hypertension Father    Hypertension Sister    Stroke Sister    Sickle cell anemia Sister    Cancer Maternal Grandmother        Ovarian   Heart disease Maternal Grandfather    Social History   Tobacco Use   Smoking status: Never   Smokeless tobacco: Never  Vaping Use   Vaping status: Never Used  Substance Use  Topics   Alcohol use: Not Currently    Alcohol/week: 8.0 standard drinks of alcohol    Types: 8 Glasses of wine per week    Comment: occasional   Drug use: Yes    Types: Marijuana, Cocaine    Comment: 06/10/23 occasional marijauana, last used cocaine December 2024   Allergies  Allergen Reactions   Lisinopril Swelling    Angioedema   Lisinopril Anaphylaxis   Current Outpatient Medications on File Prior to Visit  Medication Sig Dispense Refill   amLODipine (NORVASC) 5 MG tablet Take 1 tablet (5 mg total) by mouth daily. (Patient not taking: Reported on 06/10/2023) 30 tablet 11   Blood Pressure Monitoring (BLOOD PRESSURE KIT) DEVI 1 each by Does not apply route once a week. 1 each 0   Blood Pressure Monitoring (BLOOD PRESSURE KIT) DEVI 1 Device by Does not apply route as needed. 1 each 0   fluticasone (FLONASE) 50 MCG/ACT nasal spray Place 1 spray into both nostrils daily. 16  g 0   Misc. Devices (GOJJI WEIGHT SCALE) MISC 1 Device by Does not apply route as needed. 1 each 0   ondansetron (ZOFRAN-ODT) 4 MG disintegrating tablet Take 1 tablet (4 mg total) by mouth every 6 (six) hours as needed for nausea. 20 tablet 0   promethazine (PHENERGAN) 25 MG tablet Take 1 tablet (25 mg total) by mouth every 6 (six) hours as needed for nausea or vomiting. 30 tablet 2   No current facility-administered medications on file prior to visit.    Review of Systems Pertinent items noted in HPI and remainder of comprehensive ROS otherwise negative.  Physical Exam:  There were no vitals filed for this visit.   ***Bedside Ultrasound for FHR check: Viable intrauterine pregnancy with positive cardiac activity noted, fetal heart rate ***bpm  Patient informed that the ultrasound is considered a limited obstetric ultrasound and is not intended to be a complete ultrasound exam.  Patient also informed that the ultrasound is not being completed with the intent of assessing for fetal or placental anomalies or any  pelvic abnormalities.  Explained that the purpose of today's ultrasound is to assess for fetal heart rate.  Patient acknowledges the purpose of the exam and the limitations of the study.  General: well-developed, well-nourished female in no acute distress  Breasts:  normal appearance, no masses or tenderness bilaterally  Skin: normal coloration and turgor, no rashes  Neurologic: oriented, normal, negative, normal mood  Extremities: normal strength, tone, and muscle mass, ROM of all joints is normal  HEENT PERRLA, extraocular movement intact and sclera clear, anicteric  Neck supple and no masses  Cardiovascular: regular rate and rhythm  Respiratory:  no respiratory distress, normal breath sounds  Abdomen: soft, non-tender; bowel sounds normal; no masses,  no organomegaly  Pelvic: normal external genitalia, no lesions, normal vaginal mucosa, normal vaginal discharge, normal cervix, ***pap smear done. 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 Supervision of high risk pregnancy, antepartum (Primary) Initial labs {Blank multiple:19196::"ordered","drawn","already done"}. Discussed ACURE4MOMs. Pt {Blank multiple:19196::"accepts","declines"} Continue prenatal vitamins. Problem list reviewed and updated. Genetic Screening discussed, {Blank multiple:19196::"First trimester screen","Quad screen","NIPS"}:  {requests/ordered/declines:14581}. Ultrasound discussed; fetal anatomic survey: {requests/ordered/declines:14581}. Anticipatory guidance about prenatal visits given including labs, ultrasounds, and testing. Discussed usage of Babyscripts and virtual visits Pap/HPV wnl 01/2021  2. Hypertension affecting pregnancy in second trimester Had been on Norvasc - *** Baseline labs in September were normal.  Start ldASA. ***  3. Pregnancy with 21 completed weeks gestation   4. Increased carrier risk for SMA ***  5. History of maternal syphilis, currently pregnant RPR negative this pregnancy  6. Multigravida of advanced maternal age in second trimester LR NIPS  7. History of pre-eclampsia in prior pregnancy, currently pregnant Will do ldASA  8. Marijuana use, continuous ***  9. Polysubstance abuse (HCC) ***     The nature of [facility] with multiple MDs and Advanced  Practice Providers was explained to patient; also emphasized that residents, students are part of our team. Routine obstetric precautions reviewed. Encouraged to seek out care at office or emergency room [facility] preferred) for urgent and/or emergent concerns. No follow-ups on file.    Milas Hock, MD, FACOG Obstetrician & Gynecologist, [facility] for [facility], [facility]

## 2023-07-16 ENCOUNTER — Encounter: Payer: Medicaid Other | Admitting: Obstetrics and Gynecology

## 2023-07-16 DIAGNOSIS — O0990 Supervision of high risk pregnancy, unspecified, unspecified trimester: Secondary | ICD-10-CM

## 2023-07-16 DIAGNOSIS — Z3A21 21 weeks gestation of pregnancy: Secondary | ICD-10-CM

## 2023-07-16 DIAGNOSIS — O09899 Supervision of other high risk pregnancies, unspecified trimester: Secondary | ICD-10-CM

## 2023-07-16 DIAGNOSIS — O162 Unspecified maternal hypertension, second trimester: Secondary | ICD-10-CM

## 2023-07-16 DIAGNOSIS — F1290 Cannabis use, unspecified, uncomplicated: Secondary | ICD-10-CM

## 2023-07-16 DIAGNOSIS — O285 Abnormal chromosomal and genetic finding on antenatal screening of mother: Secondary | ICD-10-CM

## 2023-07-16 DIAGNOSIS — F1910 Other psychoactive substance abuse, uncomplicated: Secondary | ICD-10-CM

## 2023-07-16 DIAGNOSIS — O09299 Supervision of pregnancy with other poor reproductive or obstetric history, unspecified trimester: Secondary | ICD-10-CM

## 2023-07-16 DIAGNOSIS — O09522 Supervision of elderly multigravida, second trimester: Secondary | ICD-10-CM

## 2023-07-21 NOTE — L&D Delivery Note (Signed)
 Delivery Note Continued to have variable decelerations during labor but progressed quickly to complete dilation and pushed only twice to delivery   At 12:00 AM a viable and healthy female was delivered via Vaginal, Spontaneous (Presentation: Left Occiput Anterior).  APGAR: 9, 9; weight  .   Placenta status: Spontaneous, Intact.  Cord: 3 vessels with the following complications: None.    Anesthesia: Epidural Episiotomy: None Lacerations: None Suture Repair:  none Est. Blood Loss (mL): 223  Mom to postpartum.  Baby to Couplet care / Skin to Skin.  Holmes Lusher 11/09/2023, 12:59 AM

## 2023-08-10 ENCOUNTER — Ambulatory Visit: Payer: Medicaid Other

## 2023-08-23 ENCOUNTER — Encounter: Payer: Self-pay | Admitting: *Deleted

## 2023-08-23 ENCOUNTER — Other Ambulatory Visit: Payer: Self-pay

## 2023-08-23 ENCOUNTER — Ambulatory Visit (HOSPITAL_BASED_OUTPATIENT_CLINIC_OR_DEPARTMENT_OTHER): Payer: Medicaid Other | Admitting: Maternal & Fetal Medicine

## 2023-08-23 ENCOUNTER — Ambulatory Visit: Payer: Medicaid Other

## 2023-08-23 ENCOUNTER — Ambulatory Visit: Payer: Medicaid Other | Attending: Obstetrics and Gynecology

## 2023-08-23 ENCOUNTER — Ambulatory Visit: Payer: Medicaid Other | Admitting: *Deleted

## 2023-08-23 ENCOUNTER — Other Ambulatory Visit: Payer: Self-pay | Admitting: *Deleted

## 2023-08-23 VITALS — BP 128/82 | HR 94

## 2023-08-23 DIAGNOSIS — O09522 Supervision of elderly multigravida, second trimester: Secondary | ICD-10-CM

## 2023-08-23 DIAGNOSIS — O09899 Supervision of other high risk pregnancies, unspecified trimester: Secondary | ICD-10-CM | POA: Insufficient documentation

## 2023-08-23 DIAGNOSIS — O09299 Supervision of pregnancy with other poor reproductive or obstetric history, unspecified trimester: Secondary | ICD-10-CM

## 2023-08-23 DIAGNOSIS — O09292 Supervision of pregnancy with other poor reproductive or obstetric history, second trimester: Secondary | ICD-10-CM | POA: Diagnosis not present

## 2023-08-23 DIAGNOSIS — O169 Unspecified maternal hypertension, unspecified trimester: Secondary | ICD-10-CM | POA: Diagnosis present

## 2023-08-23 DIAGNOSIS — Z3A27 27 weeks gestation of pregnancy: Secondary | ICD-10-CM | POA: Diagnosis not present

## 2023-08-23 DIAGNOSIS — F1290 Cannabis use, unspecified, uncomplicated: Secondary | ICD-10-CM

## 2023-08-23 DIAGNOSIS — O10012 Pre-existing essential hypertension complicating pregnancy, second trimester: Secondary | ICD-10-CM

## 2023-08-23 DIAGNOSIS — O0990 Supervision of high risk pregnancy, unspecified, unspecified trimester: Secondary | ICD-10-CM

## 2023-08-23 DIAGNOSIS — O10919 Unspecified pre-existing hypertension complicating pregnancy, unspecified trimester: Secondary | ICD-10-CM

## 2023-08-23 DIAGNOSIS — O162 Unspecified maternal hypertension, second trimester: Secondary | ICD-10-CM | POA: Insufficient documentation

## 2023-08-23 DIAGNOSIS — O99322 Drug use complicating pregnancy, second trimester: Secondary | ICD-10-CM | POA: Diagnosis not present

## 2023-08-23 DIAGNOSIS — O09523 Supervision of elderly multigravida, third trimester: Secondary | ICD-10-CM

## 2023-08-23 NOTE — Progress Notes (Signed)
Patient information  Patient Name: Tracy Black  Patient MRN:   [id number]  Referring practice: MFM Referring Provider: [facility])  MFM CONSULT  [name] is a 38 y.o. [phone] at [redacted]w[redacted]d here for ultrasound and consultation. Patient Active Problem List   Diagnosis Date Noted   Supervision of high risk pregnancy, antepartum 06/10/2023   Hypertension affecting pregnancy 06/10/2023   History of pre-eclampsia in prior pregnancy, currently pregnant 06/10/2023   History of maternal syphilis, currently pregnant 06/10/2023   AMA (advanced maternal age) multigravida 35+ 06/10/2023   Marijuana use, continuous 07/28/2022   Normocytic anemia 07/28/2022   Polysubstance abuse (HCC) 02/03/2022   Essential hypertension 06/29/2021   Increased carrier risk for SMA 04/12/2021    Tracy Black is doing well today with no acute concerns. She denies contractions, bleeding, or loss of fluid and reports good fetal movement.   RE hx of preeclampsia: I discussed the risk of recurrence as well as compliance with aspirin however, the patient has not started this and is likely not to benefit at this point.  I discussed the signs and symptoms of preeclampsia and the need to monitor at home blood pressure if possible.  RE THC use in pregnancy: stopped about 3 months into pregnancy.  I discussed the potential for adverse childhood neurocognitive effects.  RE CHTN: no meds. BP is well controlled.  Discussed the need for potential growth concerns and the need for serial monitoring.  I also discussed the importance of blood pressure monitoring and the risk of superimposed preeclampsia.  At this time there is no need for antenatal testing for this pregnancy unless she requires antihypertensive therapy.  RE AMA: 37y, low risk NIPT.  I discussed the potential risks associated with advanced maternal age but reassured the patient that typically the risks are greatest at age over  58.  Sonographic findings Single intrauterine pregnancy at 27w 2d  Fetal cardiac activity:  Observed and appears normal. Presentation: Cephalic. The anatomic structures that were well seen appear normal without evidence of soft markers. Due to poor acoustic windows some structures remain suboptimally visualized. Fetal biometry shows the estimated fetal weight at the 43 percentile.  Amniotic fluid: Within normal limits.  MVP: 3.95 cm. Placenta: Anterior. Adnexa: Within normal limits.  Cervical length: n/a.   Recommendations - EDD should be 11/20/2023 based on  U/S  (06/10/23). - Follow up ultrasound in 4-6 weeks to attempt visualization of the anatomy not seen and reassess the fetal growth  There are limitations of prenatal ultrasound such as the inability to detect certain abnormalities due to poor visualization. Various factors such as fetal position, gestational age and maternal body habitus may increase the difficulty in visualizing the fetal anatomy.        Review of Systems: A review of systems was performed and was negative except per HPI   Vitals and Physical Exam    08/23/2023   12:17 PM 06/10/2023   10:18 AM 03/25/2023    5:52 AM  Vitals with BMI  Weight  177 lbs 3 oz   Systolic 128 138 147  Diastolic 82 84 92  Pulse 94 75 80   Sitting comfortably on the sonogram table Nonlabored breathing Normal rate and rhythm Abdomen is nontender  Past pregnancies OB History  Gravida Para Term Preterm AB Living  6 3 3  0 2 3  SAB IAB Ectopic Multiple Live Births  1 0 0 0 3    # Outcome Date  GA Lbr Len/2nd Weight Sex Type Anes PTL Lv  6 Current           5 Term 06/30/21 [redacted]w[redacted]d / 00:07 7 lb 2.6 oz (3.25 kg) M Vag-Spont None  LIV     Birth Comments: CHTN  4 SAB 2019 [redacted]w[redacted]d            Birth Comments: had to have D&C  3 AB 2018          2 Term 08/19/13 [redacted]w[redacted]d  6 lb 6 oz (2.892 kg) M Vag-Spont None  LIV     Birth Comments: precipitous delivery- almost delivered at home, had late [facility]   1 Term 2012 [redacted]w[redacted]d  6 lb 12 oz (3.062 kg) M Vag-Spont Other  LIV     Birth Comments: Pre-ecclampsia     I spent 30 minutes reviewing the patients chart, including labs and images as well as counseling the patient about her medical conditions. Greater than 50% of the time was spent in direct face-to-face patient counseling.  Braxton Feathers  MFM,    08/23/2023  1:01 PM

## 2023-09-13 ENCOUNTER — Ambulatory Visit (HOSPITAL_COMMUNITY)
Admission: EM | Admit: 2023-09-13 | Discharge: 2023-09-13 | Disposition: A | Discharge status: Home / Self Care | Payer: Medicaid Other | Attending: Emergency Medicine | Admitting: Emergency Medicine

## 2023-09-13 ENCOUNTER — Ambulatory Visit (INDEPENDENT_AMBULATORY_CARE_PROVIDER_SITE_OTHER): Payer: Medicaid Other

## 2023-09-13 ENCOUNTER — Other Ambulatory Visit: Payer: Self-pay

## 2023-09-13 ENCOUNTER — Encounter (HOSPITAL_COMMUNITY): Payer: Self-pay | Admitting: *Deleted

## 2023-09-13 DIAGNOSIS — R051 Acute cough: Secondary | ICD-10-CM

## 2023-09-13 DIAGNOSIS — O99891 Other specified diseases and conditions complicating pregnancy: Secondary | ICD-10-CM

## 2023-09-13 DIAGNOSIS — R0602 Shortness of breath: Secondary | ICD-10-CM

## 2023-09-13 DIAGNOSIS — Z3A30 30 weeks gestation of pregnancy: Secondary | ICD-10-CM | POA: Diagnosis not present

## 2023-09-13 NOTE — ED Triage Notes (Addendum)
 PT reports a dry cough that is not improving. Pt now has muscle pain after she had a hard cough 3-4 days ago. PT pregnant due date 8-25

## 2023-09-13 NOTE — ED Provider Notes (Signed)
 [facility]    CSN: [id number] Arrival date & time: 09/13/23  0932      History   Chief Complaint Chief Complaint  Patient presents with   Cough   Muscle Pain    HPI Tracy Black is a 38 y.o. female.   Patient presents with dry cough since mid December 2024. Patient states that over the last 3-4 days she now has pain to left lower chest with coughing and deep breathing. Patient also endorses increased shortness of breath and fatigue. Denies fever, congestion, abdominal pain, nausea, vomiting, and diarrhea. Patient is [redacted] weeks pregnant.    Cough Muscle Pain    Past Medical History:  Diagnosis Date   Angio-edema 04/22/2019   Chronic hypertension 04/12/2021   Essential hypertension 04/22/2019   Gestational hypertension    Positive RPR test 02/05/2021    Patient Active Problem List   Diagnosis Date Noted   Supervision of high risk pregnancy, antepartum 06/10/2023   Hypertension affecting pregnancy 06/10/2023   History of pre-eclampsia in prior pregnancy, currently pregnant 06/10/2023   History of maternal syphilis, currently pregnant 06/10/2023   AMA (advanced maternal age) multigravida 35+ 06/10/2023   Marijuana use, continuous 07/28/2022   Normocytic anemia 07/28/2022   Polysubstance abuse (HCC) 02/03/2022   Essential hypertension 06/29/2021   Increased carrier risk for SMA 04/12/2021    Past Surgical History:  Procedure Laterality Date   WISDOM TOOTH EXTRACTION      OB History     Gravida  6   Para  3   Term  3   Preterm  0   AB  2   Living  3      SAB  1   IAB  0   Ectopic  0   Multiple  0   Live Births  3            Home Medications    Prior to Admission medications   Medication Sig Start Date End Date Taking? Authorizing Provider  ondansetron (ZOFRAN-ODT) 4 MG disintegrating tablet Take 1 tablet (4 mg total) by mouth every 6 (six) hours as needed for nausea. 06/14/23  Yes Warden Fillers, MD  promethazine  (PHENERGAN) 25 MG tablet Take 1 tablet (25 mg total) by mouth every 6 (six) hours as needed for nausea or vomiting. 03/25/23  Yes Aviva Signs, CNM  amLODipine (NORVASC) 5 MG tablet Take 1 tablet (5 mg total) by mouth daily. Patient not taking: Reported on 06/10/2023 07/28/22   Loyola Mast, MD  Blood Pressure Monitoring (BLOOD PRESSURE KIT) DEVI 1 each by Does not apply route once a week. 01/29/21   Bernerd Limbo, CNM  Blood Pressure Monitoring (BLOOD PRESSURE KIT) DEVI 1 Device by Does not apply route as needed. 06/10/23   Warden Fillers, MD  fluticasone (FLONASE) 50 MCG/ACT nasal spray Place 1 spray into both nostrils daily. Patient not taking: Reported on 08/23/2023 06/24/22   Raspet, Noberto Retort, PA-C  Misc. Devices (GOJJI WEIGHT SCALE) MISC 1 Device by Does not apply route as needed. 06/10/23   Warden Fillers, MD    Family History Family History  Problem Relation Age of Onset   Hypertension Father    Hypertension Sister    Stroke Sister    Sickle cell anemia Sister    Cancer Maternal Grandmother        Ovarian   Heart disease Maternal Grandfather     Social History Social History   Tobacco Use  Smoking status: Never   Smokeless tobacco: Never  Vaping Use   Vaping status: Never Used  Substance Use Topics   Alcohol use: Not Currently    Alcohol/week: 8.0 standard drinks of alcohol    Types: 8 Glasses of wine per week    Comment: occasional   Drug use: Yes    Types: Marijuana, Cocaine    Comment: 06/10/23 occasional marijauana, last used cocaine December 2024     Allergies   Lisinopril and Lisinopril   Review of Systems Review of Systems  Respiratory:  Positive for cough.    Per HPI  Physical Exam Triage Vital Signs ED Triage Vitals  Encounter Vitals Group     BP 09/13/23 1026 130/83     Systolic BP Percentile --      Diastolic BP Percentile --      Pulse Rate 09/13/23 1026 73     Resp 09/13/23 1026 20     Temp 09/13/23 1026 98.2 F (36.8 C)      Temp src --      SpO2 09/13/23 1026 97 %     Weight --      Height --      Head Circumference --      Peak Flow --      Pain Score 09/13/23 1022 10     Pain Loc --      Pain Education --      Exclude from Growth Chart --    No data found.  Updated Vital Signs BP 130/83   Pulse 73   Temp 98.2 F (36.8 C)   Resp 20   LMP 02/22/2023 (Within Weeks)   SpO2 97%   Visual Acuity Right Eye Distance:   Left Eye Distance:   Bilateral Distance:    Right Eye Near:   Left Eye Near:    Bilateral Near:     Physical Exam Vitals and nursing note reviewed.  Constitutional:      General: She is awake. She is not in acute distress.    Appearance: Normal appearance. She is well-developed and well-groomed. She is not ill-appearing, toxic-appearing or diaphoretic.  HENT:     Nose: Nose normal.     Mouth/Throat:     Mouth: Mucous membranes are moist.     Pharynx: Posterior oropharyngeal erythema present. No oropharyngeal exudate.  Cardiovascular:     Rate and Rhythm: Normal rate and regular rhythm.  Pulmonary:     Effort: Pulmonary effort is normal.     Breath sounds: Normal breath sounds.     Comments: Mildly short of breath when speaking, but able to speak in full sentences.  Musculoskeletal:        General: Normal range of motion.  Skin:    General: Skin is warm and dry.  Neurological:     Mental Status: She is alert.  Psychiatric:        Behavior: Behavior is cooperative.      UC Treatments / Results  Labs (all labs ordered are listed, but only abnormal results are displayed) Labs Reviewed - No data to display  EKG   Radiology DG Chest 2 View Result Date: 09/13/2023 CLINICAL DATA:  Shortness of breath and cough. EXAM: CHEST - 2 VIEW COMPARISON:  Chest radiograph dated 03/16/2018. FINDINGS: The heart size and mediastinal contours are within normal limits. Both lungs are clear. The visualized skeletal structures are unremarkable. IMPRESSION: No active cardiopulmonary  disease. Electronically Signed   By: Ceasar Mons.D.  On: 09/13/2023 13:20    Procedures Procedures (including critical care time)  Medications Ordered in UC Medications - No data to display  Initial Impression / Assessment and Plan / UC Course  I have reviewed the triage vital signs and the nursing notes.  Pertinent labs & imaging results that were available during my care of the patient were reviewed by me and considered in my medical decision making (see chart for details).     Patient presented with persistent dry cough since mid December 2024. Patient endorses pain to left lower chest with coughing and deep breathing, and increased shortness of breath and fatigue.   Upon assessment patient is mildly short of breath when speaking, but able to speak in full sentences without difficulty. Patient is not tachypneic or hypoxic upon assessment. Chest X-ray ordered to rule out underlying pneumonia or cardiopulmonary etiology. Upon my interpretation, X-ray did not reveal any obvious pneumonia or cardiopulmonary disease. Radiologist confirmed no active cardiopulmonary disease.   Recommended Delsym or Mucinex as needed for cough. Given a form with a a list of medications that are safe to take during pregnancy. Recommended following up with OB if symptoms persist. Discussed follow-up, return, and strict ER precautions.  Final Clinical Impressions(s) / UC Diagnoses   Final diagnoses:  Subacute cough   Shortness of breath during pregnancy     Discharge Instructions      Your X-ray did not reveal any pneumonia, if radiologist reads report and findings are different from my interpretation, I will call you later today and adjust treatment plan as needed.   You can take Delsym or Mucinex as needed for cough. I have attached a form to the back of your paperwork that lists medications that are safe to take while you are pregnant.   If you symptoms persist I recommend following up with OB  or you can return here as needed.   If you develop worsening shortness of breath, severe chest pain, severe weakness, or passing out please seek immediate medical treatment at the Maternal Admissions Unit or ER.      ED Prescriptions   None    PDMP not reviewed this encounter.   Wynonia Lawman A, NP 09/13/23 1324

## 2023-09-13 NOTE — Discharge Instructions (Addendum)
 Your X-ray did not reveal any pneumonia, if radiologist reads report and findings are different from my interpretation, I will call you later today and adjust treatment plan as needed.   You can take Delsym or Mucinex as needed for cough. I have attached a form to the back of your paperwork that lists medications that are safe to take while you are pregnant.   If you symptoms persist I recommend following up with OB or you can return here as needed.   If you develop worsening shortness of breath, severe chest pain, severe weakness, or passing out please seek immediate medical treatment at the Maternal Admissions Unit or ER.

## 2023-09-14 ENCOUNTER — Ambulatory Visit (HOSPITAL_COMMUNITY): Payer: Medicaid Other

## 2023-09-27 ENCOUNTER — Ambulatory Visit: Payer: Medicaid Other | Attending: Maternal & Fetal Medicine

## 2023-09-27 ENCOUNTER — Other Ambulatory Visit: Payer: Self-pay | Admitting: *Deleted

## 2023-09-27 ENCOUNTER — Ambulatory Visit: Payer: Medicaid Other | Admitting: *Deleted

## 2023-09-27 ENCOUNTER — Other Ambulatory Visit: Payer: Self-pay | Admitting: Maternal & Fetal Medicine

## 2023-09-27 ENCOUNTER — Ambulatory Visit (HOSPITAL_BASED_OUTPATIENT_CLINIC_OR_DEPARTMENT_OTHER): Admitting: Obstetrics and Gynecology

## 2023-09-27 ENCOUNTER — Other Ambulatory Visit: Payer: Self-pay

## 2023-09-27 VITALS — BP 141/82 | HR 83

## 2023-09-27 VITALS — BP 143/93 | HR 80

## 2023-09-27 DIAGNOSIS — O09899 Supervision of other high risk pregnancies, unspecified trimester: Secondary | ICD-10-CM

## 2023-09-27 DIAGNOSIS — O09299 Supervision of pregnancy with other poor reproductive or obstetric history, unspecified trimester: Secondary | ICD-10-CM | POA: Diagnosis present

## 2023-09-27 DIAGNOSIS — O10919 Unspecified pre-existing hypertension complicating pregnancy, unspecified trimester: Secondary | ICD-10-CM | POA: Insufficient documentation

## 2023-09-27 DIAGNOSIS — Z3A32 32 weeks gestation of pregnancy: Secondary | ICD-10-CM

## 2023-09-27 DIAGNOSIS — O163 Unspecified maternal hypertension, third trimester: Secondary | ICD-10-CM | POA: Diagnosis present

## 2023-09-27 DIAGNOSIS — O09523 Supervision of elderly multigravida, third trimester: Secondary | ICD-10-CM

## 2023-09-27 DIAGNOSIS — O99323 Drug use complicating pregnancy, third trimester: Secondary | ICD-10-CM

## 2023-09-27 DIAGNOSIS — O10013 Pre-existing essential hypertension complicating pregnancy, third trimester: Secondary | ICD-10-CM | POA: Insufficient documentation

## 2023-09-27 DIAGNOSIS — O10913 Unspecified pre-existing hypertension complicating pregnancy, third trimester: Secondary | ICD-10-CM

## 2023-09-27 DIAGNOSIS — O0990 Supervision of high risk pregnancy, unspecified, unspecified trimester: Secondary | ICD-10-CM | POA: Diagnosis present

## 2023-09-27 DIAGNOSIS — O09293 Supervision of pregnancy with other poor reproductive or obstetric history, third trimester: Secondary | ICD-10-CM | POA: Diagnosis not present

## 2023-09-27 DIAGNOSIS — F1290 Cannabis use, unspecified, uncomplicated: Secondary | ICD-10-CM

## 2023-09-27 NOTE — Progress Notes (Signed)
 Maternal-Fetal Medicine Consultation I had the pleasure of seeing Ms. Tracy [facility]. She is G6 P3023 at 32w 1d gestation and is here for completion of fetal anatomy.  She has chronic hypertension.  Patient stopped taking amlodipine.  Blood pressures today at our office were 141/82 and 143/93 mmHg. Patient had missed her prenatal visit appointments.  She does not have symptoms and signs of severe features of preeclampsia.  Ultrasound Fetal growth is appropriate for gestational age.  Amniotic fluid normal good fetal activity seen.  Some fetal anatomical structures were completed and appear normal.  Antenatal testing is reassuring.  BPP 8/8.  I encouraged her to make an appointment for prenatal visit and screen for gestational diabetes. I discussed the importance of good blood pressure control to prevent adverse maternal and fetal outcomes poorly controlled blood pressure is associated with maternal risks including stroke. I discussed the significance of biophysical profile and recommended weekly BPP from now till delivery. Your blood pressures are well-controlled, she should be delivered at [redacted] weeks gestation.  I encouraged her to resume amlodipine.  Recommendations -Weekly BPP from next week till delivery. -Patient will be going downstairs to make prenatal visit appointments. -GDM screening.  Consultation including face-to-face (more than 50%) counseling 10 minutes.

## 2023-09-28 ENCOUNTER — Other Ambulatory Visit: Payer: Self-pay | Admitting: *Deleted

## 2023-09-28 DIAGNOSIS — O09523 Supervision of elderly multigravida, third trimester: Secondary | ICD-10-CM

## 2023-10-04 ENCOUNTER — Ambulatory Visit

## 2023-10-04 ENCOUNTER — Encounter: Payer: Self-pay | Admitting: Family Medicine

## 2023-10-04 ENCOUNTER — Ambulatory Visit (INDEPENDENT_AMBULATORY_CARE_PROVIDER_SITE_OTHER): Admitting: Physician Assistant

## 2023-10-04 VITALS — BP 137/89 | HR 72 | Wt 186.4 lb

## 2023-10-04 VITALS — BP 137/89

## 2023-10-04 DIAGNOSIS — O09523 Supervision of elderly multigravida, third trimester: Secondary | ICD-10-CM

## 2023-10-04 DIAGNOSIS — O09293 Supervision of pregnancy with other poor reproductive or obstetric history, third trimester: Secondary | ICD-10-CM

## 2023-10-04 DIAGNOSIS — Z3A33 33 weeks gestation of pregnancy: Secondary | ICD-10-CM | POA: Diagnosis not present

## 2023-10-04 DIAGNOSIS — M940 Chondrocostal junction syndrome [Tietze]: Secondary | ICD-10-CM

## 2023-10-04 DIAGNOSIS — O09899 Supervision of other high risk pregnancies, unspecified trimester: Secondary | ICD-10-CM

## 2023-10-04 DIAGNOSIS — O0990 Supervision of high risk pregnancy, unspecified, unspecified trimester: Secondary | ICD-10-CM

## 2023-10-04 DIAGNOSIS — O09299 Supervision of pregnancy with other poor reproductive or obstetric history, unspecified trimester: Secondary | ICD-10-CM

## 2023-10-04 DIAGNOSIS — O0930 Supervision of pregnancy with insufficient antenatal care, unspecified trimester: Secondary | ICD-10-CM

## 2023-10-04 DIAGNOSIS — O0993 Supervision of high risk pregnancy, unspecified, third trimester: Secondary | ICD-10-CM | POA: Diagnosis not present

## 2023-10-04 DIAGNOSIS — O163 Unspecified maternal hypertension, third trimester: Secondary | ICD-10-CM

## 2023-10-04 DIAGNOSIS — O0933 Supervision of pregnancy with insufficient antenatal care, third trimester: Secondary | ICD-10-CM

## 2023-10-04 DIAGNOSIS — I10 Essential (primary) hypertension: Secondary | ICD-10-CM

## 2023-10-04 LAB — GLUCOSE, CAPILLARY: Glucose-Capillary: 80 mg/dL (ref 70–99)

## 2023-10-04 NOTE — Patient Instructions (Addendum)
 CYou can take up to 3000 mg Tylenol daily for the rib pain.  Try icy hot or lidocaine patches. You should rest the area, with no heavy lifting. Alternate ice and heat.

## 2023-10-04 NOTE — Progress Notes (Addendum)
 PRENATAL VISIT NOTE  Subjective:  Tracy Black is a 38 y.o. [phone] at [redacted]w[redacted]d being seen today for her first prenatal visit for this pregnancy.  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  Patient reports no complaints. Denies leaking of fluid, vaginal bleeding, contractions. Endorses fetal movement.   She is planning to breastfeed. Desires BTL for contraception.   Pain on upper left side- turning steering wheel after viral illness with cough. Hurts during any physical activity. Sick in December. Early January. Cough resolved around early jan.   The following portions of the patient's history were reviewed and updated as appropriate: allergies, current medications, past family history, past medical history, past social history, past surgical history and problem list.   Objective:  There were no vitals filed for this visit.  Fetal Status: Fetal Heart Rate (bpm): 143 Fundal Height: 33 cm       General:  Alert, oriented and cooperative. Patient is in no acute distress.  Skin: Skin is warm and dry. No rash noted.   Cardiovascular: Normal heart rate and rhythm noted  Respiratory: Normal respiratory effort, no problems with respiration noted. Clear to auscultation.   Abdomen: TTP in upper left intercostal region. Soft, gravid, appropriate for gestational age. Normal bowel sounds.       Pelvic: Cervical exam deferred       Normal cervical contour, no lesions, no bleeding following pap, normal discharge  Extremities: Normal range of motion.     Mental Status:  Normal mood and affect. Normal behavior. Normal judgment and thought content.    Indications for ASA therapy (per uptodate) One of the following: Previous pregnancy with preeclampsia, especially early onset and with an adverse outcome Yes Multifetal gestation No Chronic hypertension Yes Type 1 or 2 diabetes mellitus No Chronic kidney disease No Autoimmune disease (antiphospholipid syndrome, systemic lupus erythematosus) No  Two or more of the following: Nulliparity No Obesity (body mass index >30 kg/m2) No Family history of preeclampsia in mother or sister: unsure Age >=35 years Yes Sociodemographic characteristics (African American race, low socioeconomic level) Yes Personal risk factors (eg, previous pregnancy with low birth weight or small for gestational age infant, previous adverse pregnancy outcome [eg, stillbirth], interval >10 years between pregnancies) No   Assessment and Plan:  Pregnancy: [id number] at [redacted]w[redacted]d  1. Supervision of high risk pregnancy, antepartum (Primary) Patient doing well, feeling regular fetal movement.  BP, FHR, FH appropriate  Continue prenatal vitamins. Genetic Screening discussed: NIPS, carrier screening and AFP  Ultrasound discussed; fetal anatomic survey Problem list reviewed and updated. Reviewed Brx optimized schedule, patient agreeable The nature of [facility] with multiple MDs and other Advanced Practice Providers was explained to patient; also emphasized that residents, students are part of our team. Routine obstetric precautions reviewed.  - CBC - HIV Antibody (routine testing w rflx) - RPR - Hemoglobin A1c - POC glucose: 80 fasting  2. [redacted] weeks gestation of pregnancy Anticipatory guidance about next visits/weeks of pregnancy given.  2hr GTT next visit   3. History of pre-eclampsia in prior pregnancy, currently pregnant 4. Essential hypertension BP 137/89 No meds though amlodipine advised, per  MFM  Not taking ASA- reviewed indication Growth [place] scheduled 10/21/23 Delivery at 39 weeks Ssxs preE reviewed   5. Multigravida of advanced maternal age in third trimester   6. Costochondritis Left-sided intercostal pain x2 months s/p post-viral cough, exacerbated by increased abdominal pressure and movement; pain reproduced on palpation. Discussed supportive care including tylenol, icy hot patches, gentle stretching, alternating cold/heat, rest.   Preterm labor/first trimester warning symptoms and general obstetric precautions including but not limited to vaginal bleeding, contractions, leaking of fluid and fetal movement were reviewed in detail with the patient.  Please refer to After Visit Summary for other counseling recommendations.   Return in about 2 weeks (around 10/18/2023) for LOB+GTT.  Future Appointments  Date Time Provider Department Center  10/11/2023 10:55 AM [facility] US2 [facility]  10/21/2023  8:15 AM Christean Leaf [facility]  10/21/2023  8:50 AM [facility] LAB [facility]  10/21/2023  9:15 AM [facility] US2 [facility]  10/26/2023 10:15 AM [facility] NURSE [facility]  10/26/2023 10:30 AM [facility] US4 [facility]    Ralene Muskrat, PA-C

## 2023-10-05 NOTE — Addendum Note (Signed)
 Addended by: Clement Sayres on: 10/05/2023 07:29 AM   Modules accepted: Orders

## 2023-10-06 LAB — HIV ANTIBODY (ROUTINE TESTING W REFLEX): HIV Screen 4th Generation wRfx: NONREACTIVE

## 2023-10-06 LAB — HEMOGLOBIN A1C
Est. average glucose Bld gHb Est-mCnc: 111 mg/dL
Hgb A1c MFr Bld: 5.5 % (ref 4.8–5.6)

## 2023-10-06 LAB — CBC
Hematocrit: 35.7 % (ref 34.0–46.6)
Hemoglobin: 12 g/dL (ref 11.1–15.9)
MCH: 30.9 pg (ref 26.6–33.0)
MCHC: 33.6 g/dL (ref 31.5–35.7)
MCV: 92 fL (ref 79–97)
Platelets: 328 10*3/uL (ref 150–450)
RBC: 3.88 x10E6/uL (ref 3.77–5.28)
RDW: 12.9 % (ref 11.7–15.4)
WBC: 12.4 10*3/uL — ABNORMAL HIGH (ref 3.4–10.8)

## 2023-10-06 LAB — RPR: RPR Ser Ql: NONREACTIVE

## 2023-10-11 ENCOUNTER — Other Ambulatory Visit

## 2023-10-14 ENCOUNTER — Encounter: Admitting: Obstetrics & Gynecology

## 2023-10-18 ENCOUNTER — Ambulatory Visit

## 2023-10-18 ENCOUNTER — Other Ambulatory Visit: Payer: Self-pay

## 2023-10-18 DIAGNOSIS — O0993 Supervision of high risk pregnancy, unspecified, third trimester: Secondary | ICD-10-CM | POA: Diagnosis not present

## 2023-10-18 DIAGNOSIS — O0990 Supervision of high risk pregnancy, unspecified, unspecified trimester: Secondary | ICD-10-CM

## 2023-10-18 DIAGNOSIS — O09523 Supervision of elderly multigravida, third trimester: Secondary | ICD-10-CM

## 2023-10-18 DIAGNOSIS — O169 Unspecified maternal hypertension, unspecified trimester: Secondary | ICD-10-CM

## 2023-10-18 DIAGNOSIS — Z3A35 35 weeks gestation of pregnancy: Secondary | ICD-10-CM | POA: Diagnosis not present

## 2023-10-18 NOTE — Progress Notes (Unsigned)
   PRENATAL VISIT NOTE  Subjective:  Tracy Black is a 38 y.o. [phone] at [redacted]w[redacted]d being seen today for ongoing prenatal care.  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  Patient reports {sx:14538}.   .  .   . Denies leaking of fluid.   The following portions of the patient's history were reviewed and updated as appropriate: allergies, current medications, past family history, past medical history, past social history, past surgical history and problem list.   Objective:  There were no vitals filed for this visit.  Fetal Status:           General:  Alert, oriented and cooperative. Patient is in no acute distress.  Skin: Skin is warm and dry. No rash noted.   Cardiovascular: Normal heart rate noted  Respiratory: Normal respiratory effort, no problems with respiration noted  Abdomen: Soft, gravid, appropriate for gestational age.        Pelvic: Cervical exam deferred        Extremities: Normal range of motion.     Mental Status: Normal mood and affect. Normal behavior. Normal judgment and thought content.   Assessment and Plan:  Pregnancy: [id number] at [redacted]w[redacted]d  1. Supervision of high risk pregnancy, antepartum (Primary) Patient is doing well, feeling regular fetal movement BP, FHR, FH appropriate  2. [redacted] weeks gestation of pregnancy Anticipatory guidance about next visits/weeks of pregnancy given  3. Essential hypertension 4. History of pre-eclampsia in prior pregnancy, currently pregnant Continue ASA Normotensive, no  current meds Baseline labs pending Weekly BPP until delivery Delivery at 38-39 weeks Ssxs preE reviewed   5. Multigravida of advanced maternal age in third trimester   Preterm labor symptoms and general obstetric precautions including but not limited to vaginal bleeding, contractions, leaking of fluid and fetal movement were reviewed in detail with the patient.  Please refer to After Visit Summary for other counseling recommendations.   No follow-ups on file.  Future Appointments  Date Time Provider Department Center  10/21/2023  8:15 AM Christean Leaf [facility]  10/21/2023  8:50 AM [facility] LAB [facility]  10/21/2023  9:15 AM [facility] US2 [facility]  10/26/2023 10:15 AM [facility] NURSE [facility]  10/26/2023 10:30 AM [facility] US4 [facility]    Ralene Muskrat, PA-C

## 2023-10-19 ENCOUNTER — Other Ambulatory Visit

## 2023-10-20 ENCOUNTER — Telehealth: Payer: Self-pay

## 2023-10-20 ENCOUNTER — Other Ambulatory Visit: Payer: Self-pay | Admitting: Lactation Services

## 2023-10-20 DIAGNOSIS — O0990 Supervision of high risk pregnancy, unspecified, unspecified trimester: Secondary | ICD-10-CM

## 2023-10-21 ENCOUNTER — Ambulatory Visit: Admitting: Physician Assistant

## 2023-10-21 ENCOUNTER — Other Ambulatory Visit: Payer: Self-pay

## 2023-10-21 ENCOUNTER — Other Ambulatory Visit

## 2023-10-21 VITALS — BP 144/93 | HR 84 | Wt 189.3 lb

## 2023-10-21 DIAGNOSIS — Z3A35 35 weeks gestation of pregnancy: Secondary | ICD-10-CM | POA: Diagnosis not present

## 2023-10-21 DIAGNOSIS — O09299 Supervision of pregnancy with other poor reproductive or obstetric history, unspecified trimester: Secondary | ICD-10-CM

## 2023-10-21 DIAGNOSIS — O09523 Supervision of elderly multigravida, third trimester: Secondary | ICD-10-CM

## 2023-10-21 DIAGNOSIS — Z23 Encounter for immunization: Secondary | ICD-10-CM | POA: Diagnosis not present

## 2023-10-21 DIAGNOSIS — O0993 Supervision of high risk pregnancy, unspecified, third trimester: Secondary | ICD-10-CM | POA: Diagnosis not present

## 2023-10-21 DIAGNOSIS — O09293 Supervision of pregnancy with other poor reproductive or obstetric history, third trimester: Secondary | ICD-10-CM | POA: Diagnosis not present

## 2023-10-21 DIAGNOSIS — I10 Essential (primary) hypertension: Secondary | ICD-10-CM

## 2023-10-21 DIAGNOSIS — O0990 Supervision of high risk pregnancy, unspecified, unspecified trimester: Secondary | ICD-10-CM

## 2023-10-21 NOTE — Patient Instructions (Addendum)
 You can use up to 3000 mg of Tylenol for headaches.

## 2023-10-21 NOTE — Progress Notes (Signed)
   PRENATAL VISIT NOTE  Subjective:  Tracy Black is a 38 y.o. [phone] at [redacted]w[redacted]d being seen today for ongoing prenatal care.  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  Patient reports headache that lingers despite taking 500 mg of tylenol. She denies RUQ pain, SOB, facial/extremity edema, Contractions: Irritability. Vag. Bleeding: None.  Movement: Present. Denies leaking of fluid.   The following portions of the patient's history were reviewed and updated as appropriate: allergies, current medications, past family history, past medical history, past social history, past surgical history and problem list.   Objective:   Vitals:   10/21/23 0854 10/21/23 0902  BP: (!) 147/97 (!) 144/93  Pulse: 80 84  Weight: 189 lb 4.8 oz (85.9 kg)     Fetal Status: Fetal Heart Rate (bpm): 141 Fundal Height: 34 cm Movement: Present     General:  Alert, oriented and cooperative. Patient is in no acute distress.  Skin: Skin is warm and dry. No rash noted.   Cardiovascular: Normal heart rate noted  Respiratory: Normal respiratory effort, no problems with respiration noted  Abdomen: Soft, gravid, appropriate for gestational age.  Pain/Pressure: Present     Pelvic: Cervical exam deferred        Extremities: Normal range of motion.  Edema: None  Mental Status: Normal mood and affect. Normal behavior. Normal judgment and thought content.   Assessment and Plan:  Pregnancy: [id number] at [redacted]w[redacted]d  1. Supervision of high risk pregnancy, antepartum (Primary) Patient is doing well,  feeling regular fetal movement BP, FHR, FH appropriate  2. [redacted] weeks gestation of pregnancy Anticipatory guidance about next visits/weeks of pregnancy given  3. Essential hypertension 4. History of pre-eclampsia in prior pregnancy, currently pregnant BP elevated to 147/97, 144/93 without ssxs PreE Patient prescribed amlodipine 5mg , not taking due to lightheadedness. PreE labs today; if normal, start nifedipine XR 30 mg daily.  Ssxs preE and when to seek emergency care reviewed Home BPs BID recommended Weekly BPP until delivery Delivery at 38-39 weeks - Comp Met (CMET) - Protein / creatinine ratio, urine  5. Multigravida of advanced maternal age in third trimester  Preterm labor symptoms and general obstetric precautions including but not limited to vaginal bleeding, contractions, leaking of fluid and fetal movement were reviewed in detail with the patient.  Please refer to After Visit Summary for other counseling recommendations.   Return in about 1 week (around 10/28/2023) for [facility].  Future Appointments  Date Time Provider Department Center  10/26/2023 10:15 AM [facility] NURSE [facility]  10/26/2023 10:30 AM [facility] US4 [facility]  10/28/2023  1:15 PM [facility]    Ralene Muskrat, [place]

## 2023-10-22 ENCOUNTER — Encounter: Payer: Self-pay | Admitting: Physician Assistant

## 2023-10-22 ENCOUNTER — Other Ambulatory Visit: Payer: Self-pay | Admitting: Physician Assistant

## 2023-10-22 DIAGNOSIS — O10919 Unspecified pre-existing hypertension complicating pregnancy, unspecified trimester: Secondary | ICD-10-CM

## 2023-10-22 LAB — COMPREHENSIVE METABOLIC PANEL WITH GFR
ALT: 7 IU/L (ref 0–32)
AST: 12 IU/L (ref 0–40)
Albumin: 4 g/dL (ref 3.9–4.9)
Alkaline Phosphatase: 137 IU/L — ABNORMAL HIGH (ref 44–121)
BUN/Creatinine Ratio: 8 — ABNORMAL LOW (ref 9–23)
BUN: 5 mg/dL — ABNORMAL LOW (ref 6–20)
Bilirubin Total: 0.5 mg/dL (ref 0.0–1.2)
CO2: 20 mmol/L (ref 20–29)
Calcium: 9 mg/dL (ref 8.7–10.2)
Chloride: 103 mmol/L (ref 96–106)
Creatinine, Ser: 0.6 mg/dL (ref 0.57–1.00)
Globulin, Total: 2.5 g/dL (ref 1.5–4.5)
Glucose: 143 mg/dL — ABNORMAL HIGH (ref 70–99)
Potassium: 3.6 mmol/L (ref 3.5–5.2)
Sodium: 138 mmol/L (ref 134–144)
Total Protein: 6.5 g/dL (ref 6.0–8.5)
eGFR: 120 mL/min/{1.73_m2} (ref >59)

## 2023-10-22 LAB — GLUCOSE TOLERANCE, 2 HOURS W/ 1HR
Glucose, 1 hour: 146 mg/dL (ref 70–179)
Glucose, 2 hour: 120 mg/dL (ref 70–152)
Glucose, Fasting: 79 mg/dL (ref 70–91)

## 2023-10-22 LAB — PROTEIN / CREATININE RATIO, URINE
Creatinine, Urine: 95 mg/dL
Protein, Ur: 13.3 mg/dL
Protein/Creat Ratio: 140 mg/g{creat} (ref 0–200)

## 2023-10-22 MED ORDER — NIFEDIPINE ER OSMOTIC RELEASE 30 MG PO TB24: 30.0000 mg | ORAL_TABLET | Freq: Every day | ORAL | 2 refills | Status: DC

## 2023-10-22 NOTE — Progress Notes (Signed)
 Patient discontinued amlodipine 5mg  use due to lightheadedness, with recent in-office pressures >140/90. Rx nifedipine 30 mg daily for BP control. Patient notified.

## 2023-10-25 ENCOUNTER — Other Ambulatory Visit

## 2023-10-26 ENCOUNTER — Ambulatory Visit

## 2023-10-26 ENCOUNTER — Ambulatory Visit (HOSPITAL_BASED_OUTPATIENT_CLINIC_OR_DEPARTMENT_OTHER): Admitting: Obstetrics and Gynecology

## 2023-10-26 ENCOUNTER — Ambulatory Visit: Attending: Obstetrics and Gynecology

## 2023-10-26 VITALS — BP 136/88 | HR 86

## 2023-10-26 DIAGNOSIS — O09523 Supervision of elderly multigravida, third trimester: Secondary | ICD-10-CM

## 2023-10-26 DIAGNOSIS — O163 Unspecified maternal hypertension, third trimester: Secondary | ICD-10-CM | POA: Diagnosis present

## 2023-10-26 DIAGNOSIS — O09293 Supervision of pregnancy with other poor reproductive or obstetric history, third trimester: Secondary | ICD-10-CM | POA: Diagnosis not present

## 2023-10-26 DIAGNOSIS — F1290 Cannabis use, unspecified, uncomplicated: Secondary | ICD-10-CM

## 2023-10-26 DIAGNOSIS — Z3A36 36 weeks gestation of pregnancy: Secondary | ICD-10-CM

## 2023-10-26 DIAGNOSIS — O99323 Drug use complicating pregnancy, third trimester: Secondary | ICD-10-CM

## 2023-10-26 DIAGNOSIS — O09899 Supervision of other high risk pregnancies, unspecified trimester: Secondary | ICD-10-CM | POA: Diagnosis present

## 2023-10-26 DIAGNOSIS — O10013 Pre-existing essential hypertension complicating pregnancy, third trimester: Secondary | ICD-10-CM

## 2023-10-26 DIAGNOSIS — O09299 Supervision of pregnancy with other poor reproductive or obstetric history, unspecified trimester: Secondary | ICD-10-CM

## 2023-10-26 DIAGNOSIS — O0990 Supervision of high risk pregnancy, unspecified, unspecified trimester: Secondary | ICD-10-CM | POA: Diagnosis present

## 2023-10-26 DIAGNOSIS — O10913 Unspecified pre-existing hypertension complicating pregnancy, third trimester: Secondary | ICD-10-CM | POA: Diagnosis present

## 2023-10-26 NOTE — Progress Notes (Signed)
  Maternal-Fetal Medicine Consultation Name: Tracy Black MRN: [id number] G6 [id number] at 36w 3d gestation. Chronic hypertension.  Patient was taking amlodipine 5 mg daily but discontinued because of dizziness.  She was advised by her provider to take nifedipine XL 30 mg daily and the patient is yet to pick up her prescription.  Blood pressure today at our office is 136/88 mmHg.  She does not have signs and symptoms of severe features of preeclampsia.  Obstetric history significant for 3 term vaginal deliveries.  Ultrasound Fetal growth is appropriate for gestational age.  Amniotic fluid is normal good fetal activity seen.  Cephalic presentation.  Antenatal testing is reassuring.  BPP 8/8.  I counseled the patient on the importance of taking antihypertensives to control her blood pressures.  If amlodipine causes dizziness, she is to start taking nifedipine as prescribed. We discussed timing of delivery.  Superimposed preeclampsia occurs and more than 50% of the cases after [redacted] weeks gestation, patient can be delivered at 16- or 39-weeks' gestation.  Recommendations -NST next week -Consider delivery at 52- or 39-weeks' gestation.  Consultation including face-to-face (more than 50%) counseling 20 minutes.

## 2023-10-27 NOTE — Telephone Encounter (Signed)
 Notified pt that provider's recommendation is for her to reschedule BPP that was scheduled with provider appt to 10/25/23 as she recently had a BPP on 10/18/23.  Pt verbalized understanding with no further questions.   Tracy Black

## 2023-10-28 ENCOUNTER — Other Ambulatory Visit (HOSPITAL_COMMUNITY)
Admission: RE | Admit: 2023-10-28 | Discharge: 2023-10-28 | Disposition: A | Discharge status: Home / Self Care | Source: Ambulatory Visit | Attending: Advanced Practice Midwife | Admitting: Advanced Practice Midwife

## 2023-10-28 ENCOUNTER — Other Ambulatory Visit: Payer: Self-pay

## 2023-10-28 ENCOUNTER — Ambulatory Visit: Admitting: Advanced Practice Midwife

## 2023-10-28 VITALS — BP 145/92 | HR 78 | Wt 193.0 lb

## 2023-10-28 DIAGNOSIS — O0990 Supervision of high risk pregnancy, unspecified, unspecified trimester: Secondary | ICD-10-CM | POA: Insufficient documentation

## 2023-10-28 DIAGNOSIS — O163 Unspecified maternal hypertension, third trimester: Secondary | ICD-10-CM

## 2023-10-28 DIAGNOSIS — O10919 Unspecified pre-existing hypertension complicating pregnancy, unspecified trimester: Secondary | ICD-10-CM

## 2023-10-28 DIAGNOSIS — F1910 Other psychoactive substance abuse, uncomplicated: Secondary | ICD-10-CM | POA: Diagnosis not present

## 2023-10-28 DIAGNOSIS — O285 Abnormal chromosomal and genetic finding on antenatal screening of mother: Secondary | ICD-10-CM

## 2023-10-28 DIAGNOSIS — O09523 Supervision of elderly multigravida, third trimester: Secondary | ICD-10-CM | POA: Diagnosis not present

## 2023-10-28 DIAGNOSIS — Z3A36 36 weeks gestation of pregnancy: Secondary | ICD-10-CM

## 2023-10-28 DIAGNOSIS — O0993 Supervision of high risk pregnancy, unspecified, third trimester: Secondary | ICD-10-CM

## 2023-10-28 DIAGNOSIS — Z148 Genetic carrier of other disease: Secondary | ICD-10-CM | POA: Insufficient documentation

## 2023-10-28 DIAGNOSIS — O10913 Unspecified pre-existing hypertension complicating pregnancy, third trimester: Secondary | ICD-10-CM

## 2023-10-28 MED ORDER — NIFEDIPINE ER OSMOTIC RELEASE 30 MG PO TB24: 30.0000 mg | ORAL_TABLET | Freq: Every day | ORAL | 2 refills | Status: DC

## 2023-10-28 NOTE — Progress Notes (Signed)
 PRENATAL VISIT NOTE  Subjective:  Tracy Black is a 38 y.o. [id number] at [redacted]w[redacted]d being seen today for ongoing prenatal care.  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   Patient reports occasional contractions and     Denies HA, vision changes or epigastric pain.  Contractions: Irregular. Vag. Bleeding: None.  Movement: Present. Denies leaking of fluid.   Has not picked up Procardia Rx. Requests to have it re-sent.   The following portions of the patient's history were reviewed and updated as appropriate: allergies, current medications, past family history, past medical history, past social history, past surgical history and problem list.   Objective:   Vitals:   10/28/23 1331 10/28/23 1343  BP: (!) 142/93 (!) 145/92  Pulse: 77 78  Weight: 193 lb (87.5 kg)     Fetal Status: Fetal Heart Rate (bpm): 130 Fundal Height: 36 cm Movement: Present  Presentation: Vertex  General:  Alert, oriented and cooperative. Patient is in no acute distress.  Skin: Skin is warm and dry. No rash noted.   Cardiovascular: Normal heart rate noted  Respiratory: Normal respiratory effort, no problems with respiration noted  Abdomen: Soft, gravid, appropriate for gestational age.  Pain/Pressure: Present     Pelvic: Cervical exam performed in the presence of a chaperone Dilation: Fingertip Effacement (%): 0 Station: -3   Extremities: Normal range of motion.  Edema: None  Mental Status: Normal mood and affect. Normal behavior. Normal judgment and  thought content.   Assessment and Plan:  Pregnancy: [id number] at [redacted]w[redacted]d 1. Hypertension affecting pregnancy in third trimester (Primary) - CBC - Comp Met (CMET) - NIFEdipine (PROCARDIA-XL/NIFEDICAL-XL) 30 MG 24 hr tablet; Take 1 tablet (30 mg total) by mouth daily.  Dispense: 30 tablet; Refill: 2 - CBC; Standing - Type and screen; Standing - RPR; Standing - Comprehensive metabolic panel; Standing - Protein / creatinine ratio, urine; Standing - Strongly encouraged to start Procardia. Rx re-sent.  - IOL scheduled 38 weeks due to CHNT on meds. Orders placed. Mild-range BPs off meds. No Sx Pre-E. Labs repeated today. Precautions reviewed.   2. Supervision of high risk pregnancy, antepartum - Culture, beta strep (group b only) - GC/Chlamydia probe amp ([place])not at [facility]  3. 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 Multigravida of advanced maternal age in third trimester  5. Polysubstance abuse (HCC) - ToxAssure Flex 15, Ur  6. 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 [redacted] weeks gestation of pregnancy  8. Chronic hypertension affecting pregnancy - NIFEdipine (PROCARDIA-XL/NIFEDICAL-XL) 30 MG 24 hr tablet; Take 1 tablet (30 mg total) by mouth daily.  Dispense: 30 tablet; Refill: 2  Preterm labor symptoms and general obstetric precautions including but not limited to vaginal bleeding, contractions, leaking of fluid and fetal movement were reviewed in detail with the patient. Please refer to After Visit Summary for other counseling recommendations.   Return in about 1 week (around 11/04/2023) for ROB.  Future Appointments  Date Time Provider Department Center  11/02/2023 10:30 AM [facility] PROVIDER 1 [facility]  11/02/2023 10:45 AM [facility] NST [facility]  11/04/2023  8:55 AM Lendel Quant , CNM [facility]  11/08/2023 12:00  AM MC-LD SCHED ROOM MC-INDC None    Eryn Marandola  Felipe Horton, CNM [facility] for [employer]

## 2023-10-29 ENCOUNTER — Encounter (HOSPITAL_COMMUNITY): Payer: Self-pay | Admitting: *Deleted

## 2023-10-29 ENCOUNTER — Telehealth (HOSPITAL_COMMUNITY): Payer: Self-pay | Admitting: *Deleted

## 2023-10-29 LAB — COMPREHENSIVE METABOLIC PANEL WITH GFR
ALT: 5 IU/L (ref 0–32)
AST: 10 IU/L (ref 0–40)
Albumin: 3.9 g/dL (ref 3.9–4.9)
Alkaline Phosphatase: 133 IU/L — ABNORMAL HIGH (ref 44–121)
BUN/Creatinine Ratio: 12 (ref 9–23)
BUN: 7 mg/dL (ref 6–20)
Bilirubin Total: 0.5 mg/dL (ref 0.0–1.2)
CO2: 19 mmol/L — ABNORMAL LOW (ref 20–29)
Calcium: 9.9 mg/dL (ref 8.7–10.2)
Chloride: 105 mmol/L (ref 96–106)
Creatinine, Ser: 0.57 mg/dL (ref 0.57–1.00)
Globulin, Total: 2.2 g/dL (ref 1.5–4.5)
Glucose: 85 mg/dL (ref 70–99)
Potassium: 4.1 mmol/L (ref 3.5–5.2)
Sodium: 139 mmol/L (ref 134–144)
Total Protein: 6.1 g/dL (ref 6.0–8.5)
eGFR: 121 mL/min/{1.73_m2} (ref >59)

## 2023-10-29 LAB — CBC
Hematocrit: 31.6 % — ABNORMAL LOW (ref 34.0–46.6)
Hemoglobin: 10.9 g/dL — ABNORMAL LOW (ref 11.1–15.9)
MCH: 31.5 pg (ref 26.6–33.0)
MCHC: 34.5 g/dL (ref 31.5–35.7)
MCV: 91 fL (ref 79–97)
Platelets: 300 10*3/uL (ref 150–450)
RBC: 3.46 x10E6/uL — ABNORMAL LOW (ref 3.77–5.28)
RDW: 12.9 % (ref 11.7–15.4)
WBC: 11 10*3/uL — ABNORMAL HIGH (ref 3.4–10.8)

## 2023-10-29 LAB — GC/CHLAMYDIA PROBE AMP (~~LOC~~) NOT AT ARMC
Chlamydia: NEGATIVE
Comment: NEGATIVE
Comment: NORMAL
Neisseria Gonorrhea: NEGATIVE

## 2023-10-29 NOTE — Telephone Encounter (Signed)
 Preadmission screen

## 2023-10-30 ENCOUNTER — Encounter: Payer: Self-pay | Admitting: Advanced Practice Midwife

## 2023-10-31 LAB — TOXASSURE FLEX 15, UR
6-ACETYLMORPHINE IA: NEGATIVE ng/mL
7-aminoclonazepam: NOT DETECTED ng/mg{creat}
AMPHETAMINES IA: NEGATIVE ng/mL
Alpha-hydroxyalprazolam: NOT DETECTED ng/mg{creat}
Alpha-hydroxymidazolam: NOT DETECTED ng/mg{creat}
Alpha-hydroxytriazolam: NOT DETECTED ng/mg{creat}
Alprazolam: NOT DETECTED ng/mg{creat}
BARBITURATES IA: NEGATIVE ng/mL
BUPRENORPHINE: NEGATIVE
Benzodiazepines: NEGATIVE
Buprenorphine: NOT DETECTED ng/mg{creat}
COCAINE METABOLITE IA: NEGATIVE ng/mL
Clonazepam: NOT DETECTED ng/mg{creat}
Creatinine: 115 mg/dL
Desalkylflurazepam: NOT DETECTED ng/mg{creat}
Desmethyldiazepam: NOT DETECTED ng/mg{creat}
Desmethylflunitrazepam: NOT DETECTED ng/mg{creat}
Diazepam: NOT DETECTED ng/mg{creat}
ETHYL ALCOHOL Enzymatic: NEGATIVE g/dL
FENTANYL: NEGATIVE
Fentanyl: NOT DETECTED ng/mg{creat}
Flunitrazepam: NOT DETECTED ng/mg{creat}
Lorazepam: NOT DETECTED ng/mg{creat}
METHADONE IA: NEGATIVE ng/mL
METHADONE MTB IA: NEGATIVE ng/mL
Midazolam: NOT DETECTED ng/mg{creat}
Norbuprenorphine: NOT DETECTED ng/mg{creat}
Norfentanyl: NOT DETECTED ng/mg{creat}
OPIATE CLASS IA: NEGATIVE ng/mL
OXYCODONE CLASS IA: NEGATIVE ng/mL
Oxazepam: NOT DETECTED ng/mg{creat}
PHENCYCLIDINE IA: NEGATIVE ng/mL
TAPENTADOL, IA: NEGATIVE ng/mL
TRAMADOL IA: NEGATIVE ng/mL
Temazepam: NOT DETECTED ng/mg{creat}

## 2023-10-31 LAB — CANNABINOIDS, MS, UR RFX
Cannabinoids Confirmation: POSITIVE
Carboxy-THC: 537 ng/mg{creat}

## 2023-11-01 ENCOUNTER — Encounter: Payer: Self-pay | Admitting: *Deleted

## 2023-11-01 ENCOUNTER — Encounter: Payer: Self-pay | Admitting: Advanced Practice Midwife

## 2023-11-01 LAB — CULTURE, BETA STREP (GROUP B ONLY): Strep Gp B Culture: NEGATIVE

## 2023-11-02 ENCOUNTER — Ambulatory Visit: Attending: Obstetrics and Gynecology

## 2023-11-02 ENCOUNTER — Ambulatory Visit

## 2023-11-03 NOTE — Progress Notes (Deleted)
   PRENATAL VISIT NOTE  Subjective:  Tracy Black is a 38 y.o. [phone] at [redacted]w[redacted]d being seen today for ongoing prenatal care.  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   Patient reports {sx:14538}.   .  .   . Denies leaking of fluid.   The following portions of the patient's history were reviewed and updated as appropriate: allergies, current medications, past family history, past medical history, past social history, past surgical history and problem list.   Objective:  There were no vitals filed for this visit.  Fetal Status:           General:  Alert, oriented and cooperative. Patient is in no acute distress.  Skin: Skin is warm and dry. No rash noted.   Cardiovascular: Normal heart rate noted  Respiratory: Normal respiratory effort, no problems with respiration noted  Abdomen: Soft, gravid, appropriate for gestational age.        Pelvic: {Blank single:19197::"Cervical exam performed in the presence of a chaperone","Cervical exam deferred"}        Extremities: Normal range of motion.     Mental Status: Normal mood and affect. Normal behavior. Normal judgment and thought content.   US  10/26/23: Est. FW: 3030 gm 6 lb 11 oz 63 %   Assessment and Plan:  Pregnancy: [id number] at [redacted]w[redacted]d 1. Essential hypertension (Primary) - *** Procardia - IOL 4/2 NM 2. 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 Supervision of high risk pregnancy, antepartum  4. History of pre-eclampsia in prior pregnancy, currently pregnant  5. History of  maternal syphilis, currently pregnant - RPR neg this pregnancy 6. Multigravida of advanced maternal age in third trimester - Antenatal testing per MFM  7. Carrier of spinal muscular atrophy - Partner test *** 8. Marijuana use, continuous  9. [redacted] weeks gestation of pregnancy  Term labor symptoms and general obstetric precautions including but not limited to vaginal bleeding, contractions, leaking of fluid and fetal movement were reviewed in detail with the patient. Please refer to After Visit Summary for other counseling recommendations.   No follow-ups on file.  Future Appointments  Date Time Provider Department Center  11/04/2023  8:55 AM Eddye Goodie , CNM [facility]  11/08/2023 12:00 AM MC-LD SCHED ROOM MC-INDC None    Tracy Black  Tracy Black [facility] [employer]

## 2023-11-04 ENCOUNTER — Encounter: Admitting: Advanced Practice Midwife

## 2023-11-04 DIAGNOSIS — O09899 Supervision of other high risk pregnancies, unspecified trimester: Secondary | ICD-10-CM

## 2023-11-04 DIAGNOSIS — Z148 Genetic carrier of other disease: Secondary | ICD-10-CM

## 2023-11-04 DIAGNOSIS — F1290 Cannabis use, unspecified, uncomplicated: Secondary | ICD-10-CM

## 2023-11-04 DIAGNOSIS — O09523 Supervision of elderly multigravida, third trimester: Secondary | ICD-10-CM

## 2023-11-04 DIAGNOSIS — O285 Abnormal chromosomal and genetic finding on antenatal screening of mother: Secondary | ICD-10-CM

## 2023-11-04 DIAGNOSIS — O0990 Supervision of high risk pregnancy, unspecified, unspecified trimester: Secondary | ICD-10-CM

## 2023-11-04 DIAGNOSIS — I10 Essential (primary) hypertension: Secondary | ICD-10-CM

## 2023-11-04 DIAGNOSIS — Z3A37 37 weeks gestation of pregnancy: Secondary | ICD-10-CM

## 2023-11-04 DIAGNOSIS — O09299 Supervision of pregnancy with other poor reproductive or obstetric history, unspecified trimester: Secondary | ICD-10-CM

## 2023-11-08 ENCOUNTER — Encounter (HOSPITAL_COMMUNITY): Payer: Self-pay | Admitting: Family Medicine

## 2023-11-08 ENCOUNTER — Inpatient Hospital Stay (HOSPITAL_COMMUNITY): Admitting: Anesthesiology

## 2023-11-08 ENCOUNTER — Inpatient Hospital Stay (HOSPITAL_COMMUNITY)
Admission: RE | Admit: 2023-11-08 | Discharge: 2023-11-10 | DRG: 806 | Disposition: A | Discharge status: Home / Self Care | Attending: Obstetrics and Gynecology | Admitting: Obstetrics and Gynecology

## 2023-11-08 ENCOUNTER — Inpatient Hospital Stay (HOSPITAL_COMMUNITY)

## 2023-11-08 ENCOUNTER — Other Ambulatory Visit: Payer: Self-pay

## 2023-11-08 DIAGNOSIS — O1092 Unspecified pre-existing hypertension complicating childbirth: Principal | ICD-10-CM | POA: Diagnosis present

## 2023-11-08 DIAGNOSIS — F1290 Cannabis use, unspecified, uncomplicated: Secondary | ICD-10-CM | POA: Diagnosis present

## 2023-11-08 DIAGNOSIS — Z3A38 38 weeks gestation of pregnancy: Secondary | ICD-10-CM

## 2023-11-08 DIAGNOSIS — O99324 Drug use complicating childbirth: Secondary | ICD-10-CM | POA: Diagnosis present

## 2023-11-08 DIAGNOSIS — O99824 Streptococcus B carrier state complicating childbirth: Secondary | ICD-10-CM | POA: Diagnosis present

## 2023-11-08 DIAGNOSIS — Z148 Genetic carrier of other disease: Secondary | ICD-10-CM | POA: Diagnosis not present

## 2023-11-08 DIAGNOSIS — O10919 Unspecified pre-existing hypertension complicating pregnancy, unspecified trimester: Principal | ICD-10-CM

## 2023-11-08 DIAGNOSIS — Z8249 Family history of ischemic heart disease and other diseases of the circulatory system: Secondary | ICD-10-CM | POA: Diagnosis not present

## 2023-11-08 DIAGNOSIS — Z888 Allergy status to other drugs, medicaments and biological substances status: Secondary | ICD-10-CM | POA: Diagnosis not present

## 2023-11-08 DIAGNOSIS — O9902 Anemia complicating childbirth: Secondary | ICD-10-CM | POA: Diagnosis present

## 2023-11-08 DIAGNOSIS — O09523 Supervision of elderly multigravida, third trimester: Secondary | ICD-10-CM | POA: Diagnosis not present

## 2023-11-08 DIAGNOSIS — O99820 Streptococcus B carrier state complicating pregnancy: Secondary | ICD-10-CM | POA: Diagnosis not present

## 2023-11-08 DIAGNOSIS — O163 Unspecified maternal hypertension, third trimester: Secondary | ICD-10-CM

## 2023-11-08 DIAGNOSIS — O1002 Pre-existing essential hypertension complicating childbirth: Secondary | ICD-10-CM | POA: Diagnosis not present

## 2023-11-08 LAB — COMPREHENSIVE METABOLIC PANEL WITH GFR
ALT: 6 U/L (ref 0–44)
AST: 14 U/L — ABNORMAL LOW (ref 15–41)
Albumin: 3 g/dL — ABNORMAL LOW (ref 3.5–5.0)
Alkaline Phosphatase: 95 U/L (ref 38–126)
Anion gap: 8 (ref 5–15)
BUN: 6 mg/dL (ref 6–20)
CO2: 23 mmol/L (ref 22–32)
Calcium: 9.5 mg/dL (ref 8.9–10.3)
Chloride: 108 mmol/L (ref 98–111)
Creatinine, Ser: 0.62 mg/dL (ref 0.44–1.00)
GFR, Estimated: >60 mL/min (ref >60)
Glucose, Bld: 82 mg/dL (ref 70–99)
Potassium: 3.7 mmol/L (ref 3.5–5.1)
Sodium: 139 mmol/L (ref 135–145)
Total Bilirubin: 0.9 mg/dL (ref 0.0–1.2)
Total Protein: 6.4 g/dL — ABNORMAL LOW (ref 6.5–8.1)

## 2023-11-08 LAB — CBC
HCT: 31.6 % — ABNORMAL LOW (ref 36.0–46.0)
HCT: 32 % — ABNORMAL LOW (ref 36.0–46.0)
Hemoglobin: 10.9 g/dL — ABNORMAL LOW (ref 12.0–15.0)
Hemoglobin: 11 g/dL — ABNORMAL LOW (ref 12.0–15.0)
MCH: 31.1 pg (ref 26.0–34.0)
MCH: 31.6 pg (ref 26.0–34.0)
MCHC: 34.4 g/dL (ref 30.0–36.0)
MCHC: 34.5 g/dL (ref 30.0–36.0)
MCV: 90 fL (ref 80.0–100.0)
MCV: 92 fL (ref 80.0–100.0)
Platelets: 306 10*3/uL (ref 150–400)
Platelets: 343 10*3/uL (ref 150–400)
RBC: 3.48 MIL/uL — ABNORMAL LOW (ref 3.87–5.11)
RBC: 3.51 MIL/uL — ABNORMAL LOW (ref 3.87–5.11)
RDW: 13.5 % (ref 11.5–15.5)
RDW: 13.7 % (ref 11.5–15.5)
WBC: 13.1 10*3/uL — ABNORMAL HIGH (ref 4.0–10.5)
WBC: 13.6 10*3/uL — ABNORMAL HIGH (ref 4.0–10.5)
nRBC: 0 % (ref 0.0–0.2)
nRBC: 0 % (ref 0.0–0.2)

## 2023-11-08 LAB — PROTEIN / CREATININE RATIO, URINE
Creatinine, Urine: 120 mg/dL
Protein Creatinine Ratio: 0.1 mg/mg{creat} (ref 0.00–0.15)
Total Protein, Urine: 12 mg/dL

## 2023-11-08 LAB — TYPE AND SCREEN
ABO/RH(D): O POS
Antibody Screen: NEGATIVE

## 2023-11-08 LAB — RPR: RPR Ser Ql: NONREACTIVE

## 2023-11-08 MED ORDER — TERBUTALINE SULFATE 1 MG/ML IJ SOLN: 0.2500 mg | Freq: Once | INTRAMUSCULAR | Status: DC | PRN

## 2023-11-08 MED ORDER — EPHEDRINE 5 MG/ML INJ: 10.0000 mg | INTRAVENOUS | Status: DC | PRN

## 2023-11-08 MED ORDER — LACTATED RINGERS IV SOLN
500.0000 mL | Freq: Once | INTRAVENOUS | Status: AC
  Administered 2023-11-08: 500 mL via INTRAVENOUS

## 2023-11-08 MED ORDER — LACTATED RINGERS IV SOLN: 500.0000 mL | Freq: Once | INTRAVENOUS | Status: DC

## 2023-11-08 MED ORDER — OXYCODONE-ACETAMINOPHEN 5-325 MG PO TABS: 1.0000 | ORAL_TABLET | ORAL | Status: DC | PRN

## 2023-11-08 MED ORDER — LACTATED RINGERS IV SOLN: 500.0000 mL | INTRAVENOUS | Status: DC | PRN

## 2023-11-08 MED ORDER — LABETALOL HCL 100 MG PO TABS
100.0000 mg | ORAL_TABLET | Freq: Three times a day (TID) | ORAL | Status: DC
  Administered 2023-11-08 (×3): 100 mg via ORAL
  Filled 2023-11-08 (×3): qty 1

## 2023-11-08 MED ORDER — SOD CITRATE-CITRIC ACID 500-334 MG/5ML PO SOLN: 30.0000 mL | ORAL | Status: DC | PRN

## 2023-11-08 MED ORDER — OXYCODONE-ACETAMINOPHEN 5-325 MG PO TABS: 2.0000 | ORAL_TABLET | ORAL | Status: DC | PRN

## 2023-11-08 MED ORDER — OXYTOCIN-SODIUM CHLORIDE 30-0.9 UT/500ML-% IV SOLN
2.5000 [IU]/h | INTRAVENOUS | Status: DC
  Filled 2023-11-08: qty 500

## 2023-11-08 MED ORDER — FENTANYL-BUPIVACAINE-NACL 0.5-0.125-0.9 MG/250ML-% EP SOLN
12.0000 mL/h | EPIDURAL | Status: DC | PRN
Start: 2023-11-08 — End: 2023-11-09
  Administered 2023-11-08: 12 mL/h via EPIDURAL
  Filled 2023-11-08: qty 250

## 2023-11-08 MED ORDER — LIDOCAINE-EPINEPHRINE (PF) 2 %-1:200000 IJ SOLN
INTRAMUSCULAR | Status: DC | PRN
  Administered 2023-11-08: 3 mL via EPIDURAL

## 2023-11-08 MED ORDER — PHENYLEPHRINE 80 MCG/ML (10ML) SYRINGE FOR IV PUSH (FOR BLOOD PRESSURE SUPPORT)
80.0000 ug | PREFILLED_SYRINGE | INTRAVENOUS | Status: DC | PRN
Start: 2023-11-08 — End: 2023-11-09

## 2023-11-08 MED ORDER — ONDANSETRON HCL 4 MG/2ML IJ SOLN
4.0000 mg | Freq: Four times a day (QID) | INTRAMUSCULAR | Status: DC | PRN
  Administered 2023-11-08: 4 mg via INTRAVENOUS
  Filled 2023-11-08: qty 2

## 2023-11-08 MED ORDER — PHENYLEPHRINE 80 MCG/ML (10ML) SYRINGE FOR IV PUSH (FOR BLOOD PRESSURE SUPPORT): 80.0000 ug | PREFILLED_SYRINGE | INTRAVENOUS | Status: DC | PRN

## 2023-11-08 MED ORDER — FENTANYL CITRATE (PF) 100 MCG/2ML IJ SOLN
INTRAMUSCULAR | Status: AC
  Filled 2023-11-08: qty 2

## 2023-11-08 MED ORDER — LACTATED RINGERS AMNIOINFUSION: INTRAVENOUS | Status: DC

## 2023-11-08 MED ORDER — EPHEDRINE 5 MG/ML INJ
10.0000 mg | INTRAVENOUS | Status: DC | PRN
Start: 2023-11-08 — End: 2023-11-08

## 2023-11-08 MED ORDER — MISOPROSTOL 25 MCG QUARTER TABLET
25.0000 ug | ORAL_TABLET | ORAL | Status: DC
  Administered 2023-11-08: 25 ug via VAGINAL
  Filled 2023-11-08: qty 1

## 2023-11-08 MED ORDER — PHENYLEPHRINE 80 MCG/ML (10ML) SYRINGE FOR IV PUSH (FOR BLOOD PRESSURE SUPPORT)
80.0000 ug | PREFILLED_SYRINGE | INTRAVENOUS | Status: DC | PRN
Start: 2023-11-08 — End: 2023-11-08

## 2023-11-08 MED ORDER — FENTANYL CITRATE (PF) 100 MCG/2ML IJ SOLN
100.0000 ug | INTRAMUSCULAR | Status: DC | PRN
  Administered 2023-11-08 (×5): 100 ug via INTRAVENOUS
  Filled 2023-11-08 (×4): qty 2

## 2023-11-08 MED ORDER — FLEET ENEMA RE ENEM: 1.0000 | ENEMA | RECTAL | Status: DC | PRN

## 2023-11-08 MED ORDER — ACETAMINOPHEN 325 MG PO TABS: 650.0000 mg | ORAL_TABLET | ORAL | Status: DC | PRN

## 2023-11-08 MED ORDER — DIPHENHYDRAMINE HCL 50 MG/ML IJ SOLN: 12.5000 mg | INTRAMUSCULAR | Status: DC | PRN

## 2023-11-08 MED ORDER — LIDOCAINE HCL (PF) 1 % IJ SOLN: 30.0000 mL | INTRAMUSCULAR | Status: DC | PRN

## 2023-11-08 MED ORDER — FENTANYL-BUPIVACAINE-NACL 0.5-0.125-0.9 MG/250ML-% EP SOLN: 12.0000 mL/h | EPIDURAL | Status: DC | PRN

## 2023-11-08 MED ORDER — OXYTOCIN-SODIUM CHLORIDE 30-0.9 UT/500ML-% IV SOLN
1.0000 m[IU]/min | INTRAVENOUS | Status: DC
  Administered 2023-11-08: 2 m[IU]/min via INTRAVENOUS

## 2023-11-08 MED ORDER — OXYTOCIN BOLUS FROM INFUSION
333.0000 mL | Freq: Once | INTRAVENOUS | Status: AC
  Administered 2023-11-09: 333 mL via INTRAVENOUS

## 2023-11-08 MED ORDER — MISOPROSTOL 50MCG HALF TABLET
50.0000 ug | ORAL_TABLET | Freq: Once | ORAL | Status: AC
  Administered 2023-11-08: 50 ug via BUCCAL
  Filled 2023-11-08: qty 1

## 2023-11-08 MED ORDER — LACTATED RINGERS IV SOLN: INTRAVENOUS | Status: DC

## 2023-11-08 NOTE — Progress Notes (Signed)
 38 yo [id number] @ 38+1 here for iol for chtn, ama. S/p cytotec  and foley, now contracting painfully ever 3 min or so, intact. Cat 1 tracing. Will manage expectantly for now, start pitocin  as needed, arom when safe to do so (possibly next check).

## 2023-11-08 NOTE — Progress Notes (Signed)
 38 yo [id number] @ 38+1 here for iol for chtn, ama. S/p cytotec  and foley, arom 12:30 today, currently on pitocin , with recurrent variables, good variability in between. This despite amnioinfusion. Mvus greater than 200. Will d/c pitocin  and monitor, terb prn.

## 2023-11-08 NOTE — Progress Notes (Signed)
 Labor Progress Note Tracy Black is a 38 y.o. [phone] at [redacted]w[redacted]d presented for IOL for CHTN in the setting of AMA  S:  Coping well  O:  BP (!) 155/95   Pulse 71   Temp 98 F (36.7 C) (Oral)   Resp 16   Ht 5\' 7"  (1.702 m)   Wt 87.9 kg   LMP 02/22/2023 (Within Weeks)   SpO2 95%   BMI 30.35 kg/m  EFM: baseline 125 bpm/ moderate variability/ 15x15 accels/ absent decels  Toco/IUPC: 2-4 SVE: Dilation: 1 Presentation: Vertex (BSUS) Exam by:: Boyd Cabal, RN Pitocin : 0 mu/min  A/P: 38 y.o. [id number] [redacted]w[redacted]d  IOL for CHTN in the setting of AMA 1. Labor: IOL in latent phase of labor s/p dual route cytotec . RBA of FB insertion d/w patient with patient agreeable to FB placement. Filled with 60cc of fluid and traction placed. Patient tolerated well. Plan repeat PO only cytotec  25mcg. Consider AROM and pitocin  after FB expulsion.  2. FWB: Cat 1 3. Pain: Coping well, fentanyl  x1. May have nitrous or epidural on request.  4. GBS negative   Anticipate NVSB.  Salomon Cree, CNM 5:39 AM

## 2023-11-08 NOTE — Anesthesia Procedure Notes (Signed)
 Epidural Patient location during procedure: OB Start time: 11/08/2023 6:39 PM End time: 11/08/2023 6:44 PM  Staffing Anesthesiologist: Willian Harrow, MD Performed: anesthesiologist   Preanesthetic Checklist Completed: patient identified, IV checked, site marked, risks and benefits discussed, surgical consent, monitors and equipment checked, pre-op evaluation and timeout performed  Epidural Patient position: sitting Prep: DuraPrep Patient monitoring: heart rate, continuous pulse ox and blood pressure Approach: midline Location: L3-L4 Injection technique: LOR saline  Needle:  Needle type: Tuohy  Needle gauge: 17 G Needle length: 9 cm Catheter type: closed end flexible Catheter size: 20 Guage Test dose: negative and 1.5% lidocaine   Assessment Events: blood not aspirated, no cerebrospinal fluid, injection not painful, no injection resistance and no paresthesia  Additional Notes LOR @ 5  Patient identified. Risks/Benefits/Options discussed with patient including but not limited to bleeding, infection, nerve damage, paralysis, failed block, incomplete pain control, headache, blood pressure changes, nausea, vomiting, reactions to medications, itching and postpartum back pain. Confirmed with bedside nurse the patient's most recent platelet count. Confirmed with patient that they are not currently taking any anticoagulation, have any bleeding history or any family history of bleeding disorders. Patient expressed understanding and wished to proceed. All questions were answered. Sterile technique was used throughout the entire procedure. Please see nursing notes for vital signs. Test dose was given through epidural catheter and negative prior to continuing to dose epidural or start infusion. Warning signs of high block given to the patient including shortness of breath, tingling/numbness in hands, complete motor block, or any concerning symptoms with instructions to call for help. Patient was  given instructions on fall risk and not to get out of bed. All questions and concerns addressed with instructions to call with any issues or inadequate analgesia.    Reason for block:procedure for pain

## 2023-11-08 NOTE — Progress Notes (Signed)
 38 yo [id number] @ 38+1 here for iol for chtn, ama. S/p cytotec  and foley, now on pitocin , agreeable to arom after discussion of risks/benefits including risk of cord prolapse, arom performed light mec. Variable decel right after so re-checked, no cord palpated. 4/80/-2. Will continue expectant mgmt with pitocin  augmentation.

## 2023-11-08 NOTE — Anesthesia Preprocedure Evaluation (Signed)
 Anesthesia Evaluation  Patient identified by MRN, date of birth, ID band Patient awake    Reviewed: Allergy & Precautions, Patient's Chart, lab work & pertinent test results  Airway Mallampati: I       Dental no notable dental hx.    Pulmonary neg pulmonary ROS   Pulmonary exam normal        Cardiovascular hypertension, Pt. on medications Normal cardiovascular exam     Neuro/Psych  PSYCHIATRIC DISORDERS         GI/Hepatic negative GI ROS, Neg liver ROS,,,  Endo/Other  negative endocrine ROS    Renal/GU negative Renal ROS     Musculoskeletal   Abdominal   Peds  Hematology  (+) Blood dyscrasia, anemia   Anesthesia Other Findings   Reproductive/Obstetrics (+) Pregnancy                             Anesthesia Physical Anesthesia Plan  ASA: 2  Anesthesia Plan: Epidural   Post-op Pain Management: Minimal or no pain anticipated   Induction:   PONV Risk Score and Plan: 0  Airway Management Planned: Natural Airway  Additional Equipment: None  Intra-op Plan:   Post-operative Plan:   Informed Consent: I have reviewed the patients History and Physical, chart, labs and discussed the procedure including the risks, benefits and alternatives for the proposed anesthesia with the patient or authorized representative who has indicated his/her understanding and acceptance.       Plan Discussed with:   Anesthesia Plan Comments: (Lab Results      Component                Value               Date                      WBC                      13.6 (H)            11/08/2023                HGB                      10.9 (L)            11/08/2023                HCT                      31.6 (L)            11/08/2023                MCV                      90.0                11/08/2023                PLT                      306                 11/08/2023           )       Anesthesia  Quick Evaluation

## 2023-11-08 NOTE — Progress Notes (Signed)
 Labor Progress Note Tracy Black is a 38 y.o. [id number] at [redacted]w[redacted]d presented for IOL d/t CHTN, ama S: Patient is resting comfortably. Contractions present, tolerable  O:  BP 135/83   Pulse 76   Temp 98.1 F (36.7 C) (Oral)   Resp 18   Ht 5\' 7"  (1.702 m)   Wt 87.9 kg   LMP 02/22/2023 (Within Weeks)   SpO2 99%   BMI 30.35 kg/m  EFM: 130/moderate/variable decels  CVE: Dilation: 6 Effacement (%): 70 Cervical Position: Posterior Station: -1 Presentation: Vertex Exam by:: Dr. Annabell Key   A&P: 38 y.o. [id number] [redacted]w[redacted]d for IOL  #Labor: Progressing well. IUPC replaced, contractions remain adequate #Pain: epidural in place #FWB: Cat 2 #GBS negative  #CHTN- pressures improved with epidural, will continue to monitor  Tracy Calandra, DO [facility] [employer], [facility] 8:42 PM

## 2023-11-08 NOTE — Progress Notes (Signed)
 38 yo [id number] @ 38+1 here for iol for chtn, ama. S/p cytotec  and foley, arom 12:30 today, currently on pitocin , with recurrent variables, good variability. Remains 4.5 cm on cervical exam, iupc placed for pitocin  titration.

## 2023-11-08 NOTE — H&P (Signed)
 OBSTETRIC ADMISSION HISTORY AND PHYSICAL  Tracy Black is a 38 y.o. female [phone] with IUP at [redacted]w[redacted]d by US  at 16 weeks [facility] in the setting of AMA. She reports +FMs, No LOF, no VB, no blurry vision, headaches or peripheral edema, and RUQ pain.  She plans on breast feeding. She request interval BTL for birth control. She received her prenatal care at [facility]   Dating: By US  at 16 weeks --->  Estimated Date of Delivery: 11/21/23  Sono:    @[redacted]w[redacted]d , CWD, normal anatomy, cephalic presentation, anterior placental lie, 1072g, 43% EFW   Prenatal History/Complications: CHTN, chronic marijuana use, AMA,   Past Medical History: Past Medical History:  Diagnosis Date   Angio-edema 04/22/2019   Chronic hypertension 04/12/2021   Essential hypertension 04/22/2019   Gestational hypertension    Positive RPR test 02/05/2021    Past Surgical History: Past Surgical History:  Procedure Laterality Date   WISDOM TOOTH EXTRACTION      Obstetrical History: OB History     Gravida  6   Para  3   Term  3   Preterm  0   AB  2   Living  3      SAB  1   IAB  0   Ectopic  0   Multiple  0   Live Births  3           Social History Social History   Socioeconomic History   Marital status: Single    Spouse name: Not on file   Number of children: 3   Years of education: Not on file   Highest education level: Not on file  Occupational History   Not on file  Tobacco Use   Smoking status: Never   Smokeless tobacco: Never  Vaping Use   Vaping status: Never Used  Substance and Sexual Activity   Alcohol use: Not Currently    Alcohol/week: 8.0 standard drinks of alcohol    Types: 8 Glasses of wine per week    Comment: occasional   Drug use: Yes    Types: Marijuana, Cocaine    Comment: 06/10/23 occasional marijauana, last used cocaine December 2024   Sexual activity: Yes    Birth control/protection: None  Other Topics Concern   Not on file  Social History Narrative        Social Drivers of Health   Financial Resource Strain: Not on file  Food Insecurity: No Food Insecurity (11/08/2023)   Hunger Vital Sign    Worried About Running Out of Food in the Last Year: Never true    Ran Out of Food in the Last Year: Never true  Transportation Needs: No Transportation Needs (11/08/2023)   PRAPARE - Administrator, Civil Service (Medical): No    Lack of Transportation (Non-Medical): No  Physical Activity: Not on file  Stress: Not on file  Social Connections: Not on file    Family History: Family History  Problem Relation Age of Onset   Hypertension Father    Hypertension Sister    Stroke Sister    Sickle cell anemia Sister    Cancer Maternal Grandmother        Ovarian   Heart disease Maternal Grandfather     Allergies: Allergies  Allergen Reactions   Lisinopril Swelling    Angioedema   Lisinopril Anaphylaxis    Medications Prior to Admission  Medication Sig Dispense Refill Last Dose/Taking   Prenatal Vit-Fe Fumarate-FA (MULTIVITAMIN-PRENATAL) 27-0.8 MG TABS  tablet Take 1 tablet by mouth daily at 12 noon.   Taking   fluticasone  (FLONASE ) 50 MCG/ACT nasal spray Place 1 spray into both nostrils daily. (Patient not taking: Reported on 08/23/2023) 16 g 0    NIFEdipine  (PROCARDIA -XL/NIFEDICAL-XL) 30 MG 24 hr tablet Take 1 tablet (30 mg total) by mouth daily. 30 tablet 2      Review of Systems   All systems reviewed and negative except as stated in HPI  Blood pressure (!) 150/93, pulse 76, temperature 98 F (36.7 C), temperature source Oral, resp. rate 18, height 5\' 7"  (1.702 m), weight 87.9 kg, last menstrual period 02/22/2023, unknown if currently breastfeeding. General appearance: alert, cooperative, and appears stated age Lungs: clear to auscultation bilaterally Heart: regular rate and rhythm Abdomen: soft, non-tender; bowel sounds normal Pelvic: adequate Extremities: Homans sign is negative, no sign of DVT DTR's  +2 Presentation: cephalic Fetal monitoringBaseline: 120 bpm, Variability: Good {> 6 bpm), Accelerations: Reactive, and Decelerations: Absent Uterine activity: Irregular UC with UI Dilation: Closed Exam by:: Boyd Cabal, RN   Prenatal labs: ABO, Rh: --/--/O POS (04/21 0030) Antibody: NEG (04/21 0030) Rubella: 3.12 (11/21 1122) RPR: Non Reactive (03/17 1319)  HBsAg: Negative (11/21 1122)  HIV: Non Reactive (03/17 1319)  GBS: Negative/-- (04/10 1442)    Lab Results  Component Value Date   GBS Negative 10/28/2023   GTT WNL Genetic screening  low risk female Anatomy US  WNL  Immunization History  Administered Date(s) Administered   Influenza Inj Mdck Quad Pf 09/28/2018   Tdap 09/28/2018, 07/01/2021, 10/21/2023    Prenatal Transfer Tool  Maternal Diabetes: No Genetic Screening: Normal Maternal Ultrasounds/Referrals: Normal Fetal Ultrasounds or other Referrals:  None Maternal Substance Abuse:  Yes:  Type: Marijuana Significant Maternal Medications:  None Significant Maternal Lab Results: Group B Strep positive Number of Prenatal Visits:greater than 3 verified prenatal visits Maternal Vaccinations:TDap Other Comments:   NA   Results for orders placed or performed during the hospital encounter of 11/08/23 (from the past 24 hours)  CBC   Collection Time: 11/08/23 12:19 AM  Result Value Ref Range   WBC 13.1 (H) 4.0 - 10.5 K/uL   RBC 3.48 (L) 3.87 - 5.11 MIL/uL   Hemoglobin 11.0 (L) 12.0 - 15.0 g/dL   HCT 16.1 (L) 09.6 - 04.5 %   MCV 92.0 80.0 - 100.0 fL   MCH 31.6 26.0 - 34.0 pg   MCHC 34.4 30.0 - 36.0 g/dL   RDW 40.9 81.1 - 91.4 %   Platelets 343 150 - 400 K/uL   nRBC 0.0 0.0 - 0.2 %  Comprehensive metabolic panel   Collection Time: 11/08/23 12:19 AM  Result Value Ref Range   Sodium 139 135 - 145 mmol/L   Potassium 3.7 3.5 - 5.1 mmol/L   Chloride 108 98 - 111 mmol/L   CO2 23 22 - 32 mmol/L   Glucose, Bld 82 70 - 99 mg/dL   BUN 6 6 - 20 mg/dL   Creatinine, Ser  7.82 0.44 - 1.00 mg/dL   Calcium 9.5 8.9 - 95.6 mg/dL   Total Protein 6.4 (L) 6.5 - 8.1 g/dL   Albumin 3.0 (L) 3.5 - 5.0 g/dL   AST 14 (L) 15 - 41 U/L   ALT 6 0 - 44 U/L   Alkaline Phosphatase 95 38 - 126 U/L   Total Bilirubin 0.9 0.0 - 1.2 mg/dL   GFR, Estimated >21 >30 mL/min   Anion gap 8 5 - 15  Type and screen  Collection Time: 11/08/23 12:30 AM  Result Value Ref Range   ABO/RH(D) O POS    Antibody Screen NEG    Sample Expiration      11/11/2023,2359 Performed at [facility], [address] [place], [place] [address]     Patient Active Problem List   Diagnosis Date Noted   Chronic hypertension during pregnancy 11/08/2023   Carrier of spinal muscular atrophy 10/28/2023   Supervision of high risk pregnancy, antepartum 06/10/2023   Hypertension affecting pregnancy 06/10/2023   History of pre-eclampsia in prior pregnancy, currently pregnant 06/10/2023   History of maternal syphilis, currently pregnant 06/10/2023   AMA (advanced maternal age) multigravida 35+ 06/10/2023   Marijuana use, continuous 07/28/2022   Normocytic anemia 07/28/2022   Polysubstance abuse (HCC) 02/03/2022   Essential hypertension 06/29/2021   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 [id number] at [redacted]w[redacted]d here for IOL for CHTN  #Labor:IOL for CHTN in latent phase of labor with cervical ripening indicated. Will administer cytotec  dual route and consider repeating vs. FB placement after 4 hours.  #Pain: Coping well, epidural on request.  #FWB: Cat 1 #GBS status:  negative #Feeding: Breastmilk  #Reproductive Life planning: Tubal Ligation #Circ:  yes  Salomon Cree, CNM  11/08/2023, 2:09 AM

## 2023-11-09 DIAGNOSIS — O99324 Drug use complicating childbirth: Secondary | ICD-10-CM | POA: Diagnosis not present

## 2023-11-09 DIAGNOSIS — O1002 Pre-existing essential hypertension complicating childbirth: Secondary | ICD-10-CM | POA: Diagnosis not present

## 2023-11-09 DIAGNOSIS — Z3A38 38 weeks gestation of pregnancy: Secondary | ICD-10-CM

## 2023-11-09 DIAGNOSIS — O99820 Streptococcus B carrier state complicating pregnancy: Secondary | ICD-10-CM | POA: Diagnosis not present

## 2023-11-09 DIAGNOSIS — O09523 Supervision of elderly multigravida, third trimester: Secondary | ICD-10-CM | POA: Diagnosis not present

## 2023-11-09 LAB — CBC
HCT: 31.3 % — ABNORMAL LOW (ref 36.0–46.0)
Hemoglobin: 10.7 g/dL — ABNORMAL LOW (ref 12.0–15.0)
MCH: 31.2 pg (ref 26.0–34.0)
MCHC: 34.2 g/dL (ref 30.0–36.0)
MCV: 91.3 fL (ref 80.0–100.0)
Platelets: 314 10*3/uL (ref 150–400)
RBC: 3.43 MIL/uL — ABNORMAL LOW (ref 3.87–5.11)
RDW: 13.7 % (ref 11.5–15.5)
WBC: 20.9 10*3/uL — ABNORMAL HIGH (ref 4.0–10.5)
nRBC: 0 % (ref 0.0–0.2)

## 2023-11-09 MED ORDER — SENNOSIDES-DOCUSATE SODIUM 8.6-50 MG PO TABS
2.0000 | ORAL_TABLET | ORAL | Status: DC
  Administered 2023-11-09 – 2023-11-10 (×2): 2 via ORAL
  Filled 2023-11-09 (×2): qty 2

## 2023-11-09 MED ORDER — DIPHENHYDRAMINE HCL 25 MG PO CAPS: 25.0000 mg | ORAL_CAPSULE | Freq: Four times a day (QID) | ORAL | Status: DC | PRN

## 2023-11-09 MED ORDER — DIBUCAINE (PERIANAL) 1 % EX OINT: 1.0000 | TOPICAL_OINTMENT | CUTANEOUS | Status: DC | PRN

## 2023-11-09 MED ORDER — ACETAMINOPHEN 325 MG PO TABS
650.0000 mg | ORAL_TABLET | ORAL | Status: DC | PRN
  Administered 2023-11-09: 650 mg via ORAL
  Filled 2023-11-09: qty 2

## 2023-11-09 MED ORDER — SIMETHICONE 80 MG PO CHEW: 80.0000 mg | CHEWABLE_TABLET | ORAL | Status: DC | PRN

## 2023-11-09 MED ORDER — TETANUS-DIPHTH-ACELL PERTUSSIS 5-2.5-18.5 LF-MCG/0.5 IM SUSY: 0.5000 mL | PREFILLED_SYRINGE | Freq: Once | INTRAMUSCULAR | Status: DC

## 2023-11-09 MED ORDER — WITCH HAZEL-GLYCERIN EX PADS: 1.0000 | MEDICATED_PAD | CUTANEOUS | Status: DC | PRN

## 2023-11-09 MED ORDER — IBUPROFEN 600 MG PO TABS
600.0000 mg | ORAL_TABLET | Freq: Four times a day (QID) | ORAL | Status: DC
  Administered 2023-11-09 – 2023-11-10 (×5): 600 mg via ORAL
  Filled 2023-11-09 (×6): qty 1

## 2023-11-09 MED ORDER — NIFEDIPINE ER OSMOTIC RELEASE 30 MG PO TB24
30.0000 mg | ORAL_TABLET | Freq: Two times a day (BID) | ORAL | Status: DC
  Administered 2023-11-09 – 2023-11-10 (×3): 30 mg via ORAL
  Filled 2023-11-09 (×4): qty 1

## 2023-11-09 MED ORDER — ZOLPIDEM TARTRATE 5 MG PO TABS: 5.0000 mg | ORAL_TABLET | Freq: Every evening | ORAL | Status: DC | PRN

## 2023-11-09 MED ORDER — FUROSEMIDE 20 MG PO TABS
20.0000 mg | ORAL_TABLET | Freq: Every day | ORAL | Status: DC
  Administered 2023-11-09 – 2023-11-10 (×2): 20 mg via ORAL
  Filled 2023-11-09 (×2): qty 1

## 2023-11-09 MED ORDER — ONDANSETRON HCL 4 MG/2ML IJ SOLN: 4.0000 mg | INTRAMUSCULAR | Status: DC | PRN

## 2023-11-09 MED ORDER — BENZOCAINE-MENTHOL 20-0.5 % EX AERO
1.0000 | INHALATION_SPRAY | CUTANEOUS | Status: DC | PRN
  Administered 2023-11-09: 1 via TOPICAL
  Filled 2023-11-09: qty 56

## 2023-11-09 MED ORDER — PRENATAL MULTIVITAMIN CH
1.0000 | ORAL_TABLET | Freq: Every day | ORAL | Status: DC
  Administered 2023-11-09 – 2023-11-10 (×2): 1 via ORAL
  Filled 2023-11-09 (×2): qty 1

## 2023-11-09 MED ORDER — COCONUT OIL OIL: 1.0000 | TOPICAL_OIL | Status: DC | PRN

## 2023-11-09 MED ORDER — ONDANSETRON HCL 4 MG PO TABS: 4.0000 mg | ORAL_TABLET | ORAL | Status: DC | PRN

## 2023-11-09 NOTE — Anesthesia Postprocedure Evaluation (Signed)
 Anesthesia Post Note  Patient: Tracy Black  Procedure(s) Performed: AN AD HOC LABOR EPIDURAL     Patient location during evaluation: Mother Baby Anesthesia Type: Epidural Level of consciousness: awake, awake and alert and oriented Pain management: satisfactory to patient Vital Signs Assessment: post-procedure vital signs reviewed and stable Respiratory status: spontaneous breathing, nonlabored ventilation and respiratory function stable Cardiovascular status: stable and blood pressure returned to baseline Postop Assessment: no headache, no backache, patient able to bend at knees, no apparent nausea or vomiting, able to ambulate and adequate PO intake Anesthetic complications: no   No notable events documented.  Last Vitals:  Vitals:   11/09/23 1130 11/09/23 1530  BP: (!) 158/102 (!) 141/92  Pulse: 79 80  Resp: 17 16  Temp: 36.7 C 36.7 C  SpO2: 100% 100%    Last Pain:  Vitals:   11/09/23 1530  TempSrc: Oral  PainSc:    Pain Goal:                   Rishon Thilges

## 2023-11-09 NOTE — Progress Notes (Signed)
 Post Partum Day 0 Subjective: no complaints, up ad lib, voiding, tolerating PO, and Having some low back pain  Objective: Blood pressure (!) 148/98, pulse 83, temperature 98.5 F (36.9 C), temperature source Oral, resp. rate 18, height 5\' 7"  (1.702 m), weight 87.9 kg, last menstrual period 02/22/2023, SpO2 97%, unknown if currently breastfeeding. Vitals:   11/08/23 2230 11/08/23 2300 11/08/23 2330 11/09/23 0024  BP: (!) 144/91 (!) 141/90 (!) 146/92 (!) 143/96   11/09/23 0030 11/09/23 0045 11/09/23 0100 11/09/23 0115  BP: (!) 144/91 (!) 146/99 139/84 (!) 148/91   11/09/23 0144 11/09/23 0146 11/09/23 0230 11/09/23 0317  BP: (!) 157/88 (!) 145/90 (!) 134/92 (!) 148/98    Physical Exam:  General: alert, cooperative, and no distress Lochia: appropriate Uterine Fundus: firm Incision: n/a DVT Evaluation: No evidence of DVT seen on physical exam.  Recent Labs    11/08/23 1810 11/09/23 0125  HGB 10.9* 10.7*  HCT 31.6* 31.3*    Assessment/Plan: Plan for discharge tomorrow Started Procardia  30mg  XL tonight (ordered after delivery) Will start Lasix  20mg  this morning  LOS: 1 day   Holmes Lusher, CNM 11/09/2023, 6:52 AM

## 2023-11-09 NOTE — Discharge Summary (Signed)
 Postpartum Discharge Summary  Date of Service updated***     Patient Name: Tracy Black DOB: 1985/11/01 MRN: [id number]  Date of admission: 11/08/2023 Delivery date:11/09/2023 Delivering provider: Harlee Lichtenstein Date of discharge: 11/09/2023  Admitting diagnosis: Chronic hypertension during pregnancy [O10.919] Intrauterine pregnancy: [redacted]w[redacted]d     Secondary diagnosis:  Principal Problem:   Chronic hypertension during pregnancy Active Problems:   Vaginal delivery  Additional problems: AMA     Discharge diagnosis: Term Pregnancy Delivered and CHTN                                              Post partum procedures:{Postpartum procedures:23558} Augmentation: AROM, Pitocin , Cytotec , and IP Foley Complications: None  Hospital course: Induction of Labor With Vaginal Delivery   38 y.o. yo [phone] at [redacted]w[redacted]d was admitted to the hospital 11/08/2023 for induction of labor.  Indication for induction:  Chronic HTN and AMA .  Patient had an labor course complicated byvariable decelerations Membrane Rupture Time/Date: 12:32 PM,11/08/2023  Delivery Method:Vaginal, Spontaneous Operative Delivery:N/A Episiotomy: None Lacerations:  None Details of delivery can be found in separate delivery note.  Patient had a postpartum course complicated by***. Patient is discharged home 11/09/23.  Newborn Data: Birth date:11/09/2023 Birth time:12:00 AM Gender:Female Living status:Living Apgars:9 ,9  Weight:   Magnesium  Sulfate received: No BMZ received: No Rhophylac:N/A MMR:N/A T-DaP:{Tdap:23962} Flu: N/A RSV Vaccine received: {RSV:31013} Transfusion:{Transfusion received:30440034} Immunizations administered: Immunization History  Administered Date(s) Administered   Influenza Inj Mdck Quad Pf 09/28/2018   Tdap 09/28/2018, 07/01/2021, 10/21/2023    Physical exam  Vitals:   11/08/23 2330 11/09/23 0024 11/09/23 0030 11/09/23 0045  BP: (!) 146/92 (!) 143/96 (!) 144/91 (!) 146/99  Pulse: 84 90 90  94  Resp:      Temp:      TempSrc:      SpO2: 100%  100%   Weight:      Height:       General: {Exam; general:21111117} Lochia: {Desc; appropriate/inappropriate:30686::"appropriate"} Uterine Fundus: {Desc; firm/soft:30687} Incision: {Exam; incision:21111123} DVT Evaluation: {Exam; dvt:2111122} Labs: Lab Results  Component Value Date   WBC 13.6 (H) 11/08/2023   HGB 10.9 (L) 11/08/2023   HCT 31.6 (L) 11/08/2023   MCV 90.0 11/08/2023   PLT 306 11/08/2023      Latest Ref Rng & Units 11/08/2023   12:19 AM  CMP  Glucose 70 - 99 mg/dL 82   BUN 6 - 20 mg/dL 6   Creatinine 1.30 - 8.65 mg/dL 7.84   Sodium 696 - 295 mmol/L 139   Potassium 3.5 - 5.1 mmol/L 3.7   Chloride 98 - 111 mmol/L 108   CO2 22 - 32 mmol/L 23   Calcium 8.9 - 10.3 mg/dL 9.5   Total Protein 6.5 - 8.1 g/dL 6.4   Total Bilirubin 0.0 - 1.2 mg/dL 0.9   Alkaline Phos 38 - 126 U/L 95   AST 15 - 41 U/L 14   ALT 0 - 44 U/L 6    Edinburgh Score:    07/15/2021   10:59 AM  Edinburgh Postnatal Depression Scale Screening Tool  I have been able to laugh and see the funny side of things. 0  I have looked forward with enjoyment to things. 0  I have blamed myself unnecessarily when things went wrong. 2  I have been anxious or worried for no good reason.  2  I have felt scared or panicky for no good reason. 1  Things have been getting on top of me. 1  I have been so unhappy that I have had difficulty sleeping. 0  I have felt sad or miserable. 0  I have been so unhappy that I have been crying. 0  The thought of harming myself has occurred to me. 0  Edinburgh Postnatal Depression Scale Total 6      After visit meds:  Allergies as of 11/09/2023       Reactions   Lisinopril Swelling   Angioedema   Lisinopril Anaphylaxis     Med Rec must be completed prior to using this [facility]*        Discharge home in stable condition Infant Feeding: {Baby feeding:23562} Infant Disposition:{CHL IP OB HOME WITH  GUYQIH:47425} Discharge instruction: per After Visit Summary and Postpartum booklet. Activity: Advance as tolerated. Pelvic rest for 6 weeks.  Diet: routine diet Anticipated Birth Control: {Birth Control:23956} Postpartum Appointment:{Outpatient follow up:23559} Additional Postpartum F/U: {PP Procedure:23957} Future Appointments:No future appointments. Follow up Visit:      11/09/2023 Holmes Lusher, CNM

## 2023-11-09 NOTE — Lactation Note (Signed)
 This note was copied from a baby's chart. Lactation Consultation Note  Patient Name: Boy Kiylah Loyer [id number] Date: 11/09/2023 Age:36 hours Reason for consult: Initial assessment;Early term 37-38.6wks  P4- MOB declines all LC services while in the hospital.   Feeding Mother's Current Feeding Choice: Breast Milk  Consult Status Consult Status: Complete (mother declined follow up) Date: 11/09/23    Vernette Goo BS, IBCLC 11/09/2023, 7:41 PM

## 2023-11-10 ENCOUNTER — Other Ambulatory Visit (HOSPITAL_COMMUNITY): Payer: Self-pay

## 2023-11-10 MED ORDER — FUROSEMIDE 20 MG PO TABS
20.0000 mg | ORAL_TABLET | Freq: Every day | ORAL | 0 refills | Status: DC
  Filled 2023-11-10: qty 5, 5d supply, fill #0

## 2023-11-10 MED ORDER — NIFEDIPINE ER 30 MG PO TB24
30.0000 mg | ORAL_TABLET | Freq: Two times a day (BID) | ORAL | 2 refills | Status: DC
  Filled 2023-11-10: qty 60, 30d supply, fill #0

## 2023-11-10 MED ORDER — IBUPROFEN 600 MG PO TABS: 600.0000 mg | ORAL_TABLET | Freq: Four times a day (QID) | ORAL | 0 refills | Status: DC

## 2023-11-10 NOTE — Clinical Social Work Maternal (Signed)
 CLINICAL SOCIAL WORK MATERNAL/CHILD NOTE  Patient Details  Name: Tracy Black MRN: [id number] Date of Birth: 02-28-1986  Date:  11/10/2023  Clinical Social Worker Initiating Note:  Jenney Modest Date/Time: Initiated:  11/10/23/1312     Child's Name:  Burke Carolus   Biological Parents:  Mother, Father Marque Bango 07/17/88 Fae Homans 12-10-1979)   Need for Interpreter:  None   Reason for Referral:  Current Substance Use/Substance Use During Pregnancy     Address:  [address] [place] [address]    Phone number:  [phone] (home)     Additional phone number:   Household Members/Support Persons (HM/SP):   Household Member/Support Person 1, Household Member/Support Person 2, Household Member/Support Person 3, Household Member/Support Person 4   HM/SP Name Relationship DOB or Age  HM/SP -1 Fae Homans FOB 12-10-1979  HM/SP -2 Cyril Driver Son 03-05-2010  HM/SP -3 Moss Argyle 08-19-2013  HM/SP -4 Richard Debbra Fairy 06-30-2021  HM/SP -5        HM/SP -6        HM/SP -7        HM/SP -8          Natural Supports (not living in the home):  Immediate Family, Spouse/significant other   Professional Supports: None   Employment: Unemployed   Type of Work:     Education:  Attending college   Homebound arranged:    Surveyor, quantity Resources:  Medicaid   Other Resources:  Sales executive     Cultural/Religious Considerations Which May Impact Care:    Strengths:  Ability to meet basic needs  , Home prepared for child  , Pediatrician chosen   Psychotropic Medications:         Pediatrician:    Armed forces operational officer area  Pediatrician List:   [facility] ([address] [employer])  [place]    [place]      Pediatrician Fax Number:    Risk Factors/Current Problems:  Substance Use     Cognitive State:  Alert  , Able to Concentrate  , Insightful  , Goal  Oriented  , Linear Thinking     Mood/Affect:  Comfortable  , Calm  , Interested  , Relaxed     CSW Assessment: CSW received a consult for drug exposed newborn and met MOB at bedside to complete a full psychosocial assessment. CSW entered the room, introduced herself and acknowledged she had family present. CSW asked MOB for privacy could her family stepout for the assessment; MOB was agreeable and the her guest stepped out. CSW explained her role and the reason for the visit. MOB presented bonding/holding the infant and was  polite, easy to engage, receptive to meeting with CSW, and appeared forthcoming.  CSW collected MOB's demographic information and inquired about her mental health history. MOB reported never been officially diagnosed with anxiety; however she has always experienced symptoms. MOB reported in the past participating in therapy; and found the techniques are still used today which included writing/story writing. CSW provided education regarding the baby blues period vs. perinatal mood disorders, discussed treatment and gave resources for mental health follow up if concerns arise.  CSW recommends self-evaluation during the postpartum time period using the New Mom Checklist from Postpartum Progress and encouraged MOB to contact a medical professional if symptoms are noted at any time. CSW assessed for safety with MOB SI/HI/DV;MOB denied all.  CSW asked MOB does she receive support resources; MOB said yes( foodstamps).  MOB reported having all essential items for the infant including a carseat, bassinet and crib for safe sleeping. CSW provided review of Sudden Infant Death Syndrome (SIDS) precautions.  CSW informed MOB due to [facility] during her pregnancy; the hospital will perform a UDS and CDS on the infant. If the screenings return with positive results a report to CPS will be made; MOB was understanding. CSW informed MOB that a CPS report will be completed due to the infant's UDS positive for  THC; MOB was understanding. MOB reported a hx with cocaine and THC; the cocaine use being many years ago, and the last use of THC being in [place]. MOB reported the reason for the use of THC was due to consistent nausea and the medication that was prescribed ultimately made her sleepy.  The infant's UDS was positive THC and a CPS report will be completed. CSW will continue to monitor the CDS and make a CPS report if warranted.   CSW Plan/Description:  No Further Intervention Required/No Barriers to Discharge, Sudden Infant Death Syndrome (SIDS) Education, Perinatal Mood and Anxiety Disorder (PMADs) Education, Hospital Drug Screen Policy Information, Other Information/Referral to [facility], CSW Will Continue to Monitor Umbilical Cord Tissue Drug Screen Results and Make Report if Koraima Albertsen, LCSW 11/10/2023, 1:26 PM

## 2023-11-22 ENCOUNTER — Telehealth (HOSPITAL_COMMUNITY): Payer: Self-pay | Admitting: *Deleted

## 2023-11-22 NOTE — Telephone Encounter (Signed)
 11/22/2023  Name: Tracy Black MRN: [id number] DOB: 12-14-1985  Reason for Call:  Transition of Care Hospital Discharge Call  Contact Status: Patient Contact Status: Complete  Language assistant needed:          Follow-Up Questions: Do You Have Any Concerns About Your Health As You Heal From Delivery?: No Do You Have Any Concerns About Your Infants Health?: No  Edinburgh Postnatal Depression Scale:  In the Past 7 Days:   Patient declined EPDS at this time. Requested call back before 3pm. PHQ2-9 Depression Scale:     Discharge Follow-up: Edinburgh score requires follow up?: N/A Patient was advised of the following resources:: Breastfeeding Support Group, Support Group  Post-discharge interventions: Reviewed Newborn Safe Sleep Practices  Signature Julien Odor, RN, 11/22/23, [phone]

## 2023-11-23 ENCOUNTER — Telehealth (HOSPITAL_COMMUNITY): Payer: Self-pay

## 2023-11-23 NOTE — Telephone Encounter (Signed)
 11/23/2023 1244  Name: [name] MRN: [id number] DOB: December 22, 1985  Reason for Call:  Transition of Care Hospital Discharge Call  Contact Status: Patient Contact Status: Unable to contact No answer, No voicemail option  Language assistant needed:          Follow-Up Questions:    Dimple Francis Postnatal Depression Scale:  In the Past 7 Days:    PHQ2-9 Depression Scale:     Discharge Follow-up:    Post-discharge interventions: NA  Signature  Wadell Guild

## 2024-06-25 ENCOUNTER — Ambulatory Visit (HOSPITAL_COMMUNITY)
Admission: RE | Admit: 2024-06-25 | Discharge: 2024-06-25 | Disposition: A | Discharge status: Home / Self Care | Source: Ambulatory Visit

## 2024-06-25 ENCOUNTER — Encounter (HOSPITAL_COMMUNITY): Payer: Self-pay

## 2024-06-25 VITALS — BP 152/115 | HR 69 | Temp 97.9°F | Resp 15

## 2024-06-25 DIAGNOSIS — J3481 Nasal mucositis (ulcerative): Secondary | ICD-10-CM | POA: Diagnosis not present

## 2024-06-25 LAB — HSV 1/2 PCR (SURFACE)
HSV-1 DNA: NOT DETECTED
HSV-2 DNA: DETECTED — AB

## 2024-06-25 MED ORDER — MUPIROCIN 2 % EX OINT: 1.0000 | TOPICAL_OINTMENT | Freq: Two times a day (BID) | CUTANEOUS | 2 refills | Status: AC

## 2024-06-25 NOTE — ED Triage Notes (Signed)
 Pt reports sore in her nose for 3 days. Reports some drainage and crusting. Reports hx staph before.  Reports hears like I have fluid in my ears moving.

## 2024-06-25 NOTE — Discharge Instructions (Signed)
  1. Nasal mucositis (ulcerative) (Primary) - HSV 1/2 PCR (Surface) swab collected in UC and sent to lab for further testing results should be available in 1 to 2 days - Aerobic Culture w Gram Stain (superficial specimen) collected in UC and sent to the lab for further testing results should be available in 2 to 3 days. - mupirocin  ointment (BACTROBAN ) 2 %; Apply 1 Application topically 2 (two) times daily.  Dispense: 22 g; Refill: 2  -Continue to monitor symptoms for any change in severity if there is any escalation of current symptoms or development of new symptoms follow-up in ER or return to UC for further evaluation and management.

## 2024-06-25 NOTE — ED Provider Notes (Signed)
 [facility]  Note:  This document was prepared using Conservation officer, historic buildings and may include unintentional dictation errors.  MRN: [id number] DOB: [birth year]  Subjective:   Tracy Black is a 38 y.o. female presenting for for eval ration intranasal ulcer x 3 days.  Patient reports that in the morning crusting throughout the day drainage from wound inside her right nasal passage.  Patient reports that she had staph infection with similar presentation previously was concerned that it may be recurrence patient also reports it feels like there is fluid in her ear as seen cough, nasal congestion, body aches, patient denies taking any other medication to treat symptoms prior to arrival denies any known sick contacts.  No current facility-administered medications for this encounter.  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     Past Surgical History:  Procedure Laterality Date   WISDOM TOOTH EXTRACTION      Family History  Problem Relation Age of Onset   Hypertension Father    Hypertension Sister    Stroke Sister    Sickle cell anemia Sister    Cancer Maternal Grandmother        Ovarian   Heart disease Maternal Grandfather     Social History   Tobacco Use   Smoking status: Never   Smokeless tobacco: Never  Vaping Use   Vaping status: Never Used  Substance Use Topics   Alcohol use: Not Currently    Alcohol/week: 8.0 standard drinks of alcohol    Types: 8 Glasses of wine per week    Comment: occasional   Drug use: Yes    Types: Marijuana, Cocaine    Comment:  06/10/23 occasional marijauana, last used cocaine December 2024    ROS Refer to HPI for ROS details.  Objective:    Vitals: BP (!) 152/115 (BP Location: Right Arm)   Pulse 69   Temp 97.9 F (36.6 C) (Oral)   Resp 15   LMP 06/10/2024   SpO2 97%   Physical Exam Vitals and nursing note reviewed.  Constitutional:      General: She is not in acute distress.    Appearance: Normal appearance. She is well-developed. She is not ill-appearing or toxic-appearing.  HENT:     Head: Normocephalic and atraumatic.     Nose: Signs of injury, nasal tenderness and mucosal edema present. No congestion or rhinorrhea.     Mouth/Throat:     Mouth: Mucous membranes are moist.  Cardiovascular:     Rate and Rhythm: Normal rate.  Pulmonary:     Effort: Pulmonary effort is normal. No respiratory distress.  Musculoskeletal:        General: Normal range of motion.  Skin:    General: Skin is warm and dry.  Neurological:     General: No focal deficit present.     Mental Status: She is alert and oriented to person, place, and time.  Psychiatric:        Mood and Affect: Mood normal.        Behavior: Behavior normal.     Procedures  No results found  for this or any previous visit (from the past 24 hours).  Assessment and Plan :     Discharge Instructions       1. Nasal mucositis (ulcerative) (Primary) - HSV 1/2 PCR (Surface) swab collected in UC and sent to lab for further testing results should be available in 1 to 2 days - Aerobic Culture w Gram Stain (superficial specimen) collected in UC and sent to the lab for further testing results should be available in 2 to 3 days. - mupirocin  ointment (BACTROBAN ) 2 %; Apply 1 Application topically 2 (two) times daily.  Dispense: 22 g; Refill: 2  -Continue to monitor symptoms for any change in severity if there is any escalation of current symptoms or development of new symptoms follow-up in ER or return to UC for further evaluation and  management.      Tracy Black Black Tracy Black   Tracy Black, Tracy Black, [place] 06/25/24 1037

## 2024-06-26 ENCOUNTER — Ambulatory Visit (HOSPITAL_COMMUNITY): Payer: Self-pay

## 2024-06-26 MED ORDER — ACYCLOVIR 400 MG PO TABS: 400.0000 mg | ORAL_TABLET | Freq: Three times a day (TID) | ORAL | 0 refills | Status: AC

## 2024-06-27 LAB — AEROBIC CULTURE W GRAM STAIN (SUPERFICIAL SPECIMEN): Gram Stain: NONE SEEN

## 2024-08-04 ENCOUNTER — Ambulatory Visit: Admitting: Family Medicine

## 2024-08-04 NOTE — Progress Notes (Incomplete)
" °  [facility] [address] [place] [address] Dept: [phone] Dept Fax: [phone]  Office Visit  Subjective:    Patient ID: Tracy Black, female    DOB: Jan 23, 1986, 39 y.o..   MRN: [id number]  No chief complaint on file.  History of Present Illness:  Patient is in today for testing for staph infection and HSV.    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   AMA (advanced maternal age) multigravida 35+ 06/10/2023   Marijuana use, continuous 07/28/2022   Normocytic anemia 07/28/2022   Polysubstance abuse (HCC) 02/03/2022   Essential hypertension 06/29/2021   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 22 g 2   No facility-administered medications prior to visit.   Allergies[1]   Objective:   There were no vitals filed for this visit. There is no height or weight on file to calculate BMI.   General: Well developed, well nourished. No acute distress. HEENT: Normocephalic, non-traumatic. PERRL, EOMI. Conjunctiva clear. External ears normal. EAC and  TMs normal bilaterally. Nose    clear without congestion or rhinorrhea. Mucous membranes moist. Oropharynx clear. Good dentition. Neck: Supple. No lymphadenopathy. No thyromegaly. Lungs: Clear to auscultation bilaterally. No wheezing, rales or rhonchi. CV: RRR without murmurs or rubs. Pulses 2+ bilaterally. Abdomen: Soft, non-tender. Bowel sounds positive, normal pitch and frequency. No hepatosplenomegaly. No rebound or guarding. Back: Straight. No CVA tenderness bilaterally. Extremities: Full ROM. No joint swelling or tenderness. No edema noted. Skin: Warm and dry. No rashes. Neuro: CN II-XII intact. Normal sensation and DTR bilaterally. Psych: Alert and oriented. Normal mood and affect.  Health Maintenance Due  Topic Date Due   Hepatitis B Vaccines 19-59 Average Risk (1 of 3 - 19+ 3-dose series) Never done   Influenza Vaccine  02/18/2024   COVID-19 Vaccine (1 - 2025-26 season) Never done    Lab Results {Labs (Optional):29002}    Assessment & Plan:   Problem List Items Addressed This Visit   None   No follow-ups on file.    Garnette CHRISTELLA Simpler, MD  I,Emily Lagle,acting as a scribe for Garnette CHRISTELLA Simpler, MD.,have documented all relevant documentation on the behalf of Garnette CHRISTELLA Simpler, MD.  I, Garnette CHRISTELLA Simpler, MD, have reviewed all documentation for this visit. The documentation on 08/04/2024 for the exam, diagnosis, procedures, and orders are all accurate and complete.    [1]  Allergies Allergen Reactions   Lisinopril Swelling    Angioedema   Lisinopril Anaphylaxis   "

## 2024-08-07 ENCOUNTER — Ambulatory Visit: Admitting: Family

## 2024-08-08 ENCOUNTER — Telehealth: Admitting: Emergency Medicine

## 2024-08-08 ENCOUNTER — Encounter

## 2024-08-08 ENCOUNTER — Telehealth: Admitting: Family Medicine

## 2024-08-08 DIAGNOSIS — J3481 Nasal mucositis (ulcerative): Secondary | ICD-10-CM

## 2024-08-08 MED ORDER — CLINDAMYCIN HCL 300 MG PO CAPS: 300.0000 mg | ORAL_CAPSULE | Freq: Three times a day (TID) | ORAL | 0 refills | Status: AC

## 2024-08-08 NOTE — Patient Instructions (Signed)
MRSA Infection, Diagnosis, Adult Methicillin-resistant Staphylococcus aureus (MRSA) infection is caused by bacteria called Staphylococcus aureus, or staph, that no longer respond to common antibiotic medicines (drug-resistant bacteria). MRSA infection can be hard to treat. Most of the time, MRSA can be on the skin or in the nose without causing problems (colonized). However, if MRSA enters the body through a cut, a sore, or an invasive medical device, it can cause a serious infection. What are the causes? This condition is caused by staph bacteria. Illness may develop after exposure to the bacteria through: Skin-to-skin contact with someone who is infected with MRSA. Touching surfaces that have the bacteria on them. Having a procedure or using equipment that allows MRSA to enter the body. Having MRSA that lives on your skin and then enters your body through: A cut or scratch. A surgery or procedure. The use of a medical device. Contact with the bacteria may occur: During a stay in a hospital, rehabilitation facility, nursing home, or other health care facility (health care-associated MRSA). In daily activities where there is close contact with others, such as sports, child care centers, or at home (community-associated MRSA). What increases the risk? You are more likely to develop this condition if you: Have a surgery or procedure. Have an IV or a thin tube (catheter) placed in your body. Are elderly. Are on kidney dialysis. Have recently taken an antibiotic medicine. Live in a long-term care facility. Have a chronic wound or skin ulcer. Have a weak body defense system (immune system). Play sports that involve skin-to-skin contact. Live in a crowded place, like a dormitory or [employer]. Share towels, razors, or sports equipment with other people. Have a history of MRSA infection or colonization. What are the signs or symptoms? Symptoms of this condition depend on the area that  is affected. Symptoms may include: A pus-filled pimple or boil. Pus that drains from your skin. A sore (abscess) under your skin or somewhere in your body. Fever with or without chills. Difficulty breathing. Coughing up blood. Redness, warmth, swelling, or pain in the affected area. How is this diagnosed? This condition may be diagnosed based on: A physical exam. Your medical history. Taking a sample from the infected area and growing it in a lab (culture). You may also have other tests, including: Imaging tests, such as X-rays, a CT scan, or an MRI. Lab tests, such as blood, urine, or phlegm (sputum) tests. You skin or nose may be swabbed when you are admitted to a health care facility for a procedure. This is to screen for MRSA. How is this treated? Treatment depends on the type of MRSA infection you have and how severe, deep, or extensive it is. Treatment may include: Antibiotic medicines. Surgery to drain pus from the infected area. Severe infections may require a hospital stay. Follow these instructions at home: Medicines Take over-the-counter and prescription medicines only as told by your health care provider. If you were prescribed an antibiotic medicine, use it as told by your health care provider. Do not stop using the antibiotic even if you start to feel better. Prevention Follow these instructions to avoid spreading the infection to others: Wash your hands frequently with soap and water. If soap and water are not available, use an alcohol-based hand sanitizer. Avoid close contact with those around you as much as possible. Do not use towels, razors, toothbrushes, bedding, or other items that will be used by others. Wash towels, bedding, and clothes in the washing machine with detergent  and hot water. Dry them in a hot dryer. Clean surfaces regularly to remove germs (disinfection). Use products or solutions that contain bleach. Make sure you disinfect bathroom surfaces, food  preparation areas, exercise equipment, and doorknobs.  General instructions If you have a wound, follow instructions from your health care provider about how to take care of your wound. Do not pick at scabs. Do not try to drain any infection sites or pimples. Tell all your health care providers that you have MRSA, or if you have ever had a MRSA infection. Keep all follow-up visits as told by your health care provider. This is important. Contact a health care provider if you: Do not get better. Have symptoms that get worse. Have new symptoms. Get help right away if you have: Nausea or vomiting, or if you cannot take medicine without vomiting. Trouble breathing. Chest pain. These symptoms may represent a serious problem that is an emergency. Do not wait to see if the symptoms will go away. Get medical help right away. Call your local emergency services (911 in the [place] Do not drive yourself to the hospital. Summary MRSA infection is caused by bacteria called Staphylococcus aureus, or staph, that no longer respond to common antibiotic medicines. Treatment for this condition depends on the type of MRSA infection you have and how severe, deep, and extensive it is. If you were prescribed an antibiotic medicine, use it as told by your health care provider. Do not stop using the antibiotic even if you start to feel better. Follow instructions from your health care provider to avoid spreading the infection to others. This information is not intended to replace advice given to you by your health care provider. Make sure you discuss any questions you have with your health care provider. Document Revised: 05/21/2022 Document Reviewed: 05/21/2022 Elsevier Patient Education  2024 [employer].

## 2024-08-08 NOTE — Progress Notes (Signed)
 " Virtual Visit Consent   Tracy Black, you are scheduled for a virtual visit with a [facility] provider today. Just as with appointments in the office, your consent must be obtained to participate. Your consent will be active for this visit and any virtual visit you may have with one of our providers in the next 365 days. If you have a MyChart account, a copy of this consent can be sent to you electronically.  As this is a virtual visit, video technology does not allow for your provider to perform a traditional examination. This may limit your provider's ability to fully assess your condition. If your provider identifies any concerns that need to be evaluated in person or the need to arrange testing (such as labs, EKG, etc.), we will make arrangements to do so. Although advances in technology are sophisticated, we cannot ensure that it will always work on either your end or our end. If the connection with a video visit is poor, the visit may have to be switched to a telephone visit. With either a video or telephone visit, we are not always able to ensure that we have a secure connection.  By engaging in this virtual visit, you consent to the provision of healthcare and authorize for your insurance to be billed (if applicable) for the services provided during this visit. Depending on your insurance coverage, you may receive a charge related to this service.  I need to obtain your verbal consent now. Are you willing to proceed with your visit today? Tracy Black has provided verbal consent on 08/08/2024 for a virtual visit (video or telephone). Loa Lamp, FNP  Date: 08/08/2024 5:52 PM   Virtual Visit via Video Note   I, Loa Lamp, connected with  Tracy Black  ([id number], Apr 24, 1986) on 08/08/24 at  5:45 PM EST by a video-enabled telemedicine application and verified that I am speaking with the correct person using two identifiers.  Location: Patient: Virtual Visit Location Patient:  Home Provider: Virtual Visit Location Provider: Home Office   I discussed the limitations of evaluation and management by telemedicine and the availability of in person appointments. The patient expressed understanding and agreed to proceed.    History of Present Illness: Tracy Black is a 39 y.o. who identifies as a female who was assigned female at birth, and is being seen today for staph infection in nostrils. See C & S in December. She was given mupirocin , no oral antibiotic. Her nose continues to scab, be irritated and run. Nof ever.   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  Resting comfortably  at home.  Head is normocephalic atraumatic.  No labored breathing.  Speech is clear and coherent with logical content.  Patient is alert and oriented at baseline.    Assessment and Plan: 1. Nasal  mucositis (Primary)  See culture, start clindamycin , UC as needed.   Follow Up Instructions: I discussed the assessment and treatment plan with the patient. The patient was provided an opportunity to ask questions and all were answered. The patient agreed with the plan and demonstrated an  understanding of the instructions.  A copy of instructions were sent to the patient via MyChart unless otherwise noted below.     The patient was advised to call back or seek an in-person evaluation if the symptoms worsen or if the condition fails to improve as anticipated.    Sebastiano Luecke, FNP     [1]  Allergies Allergen Reactions   Lisinopril Swelling    Angioedema   Lisinopril Anaphylaxis  [2]  Current Outpatient Medications:    mupirocin  ointment (BACTROBAN ) 2 %, Apply 1 Application topically 2 (two) times daily., Disp: 22 g, Rfl: 2  "

## 2024-08-08 NOTE — Progress Notes (Signed)
" °  Because you were already treated for this in December and didn't get better I am not able to help you by evisit, I feel your condition warrants further evaluation and I recommend that you be seen in a face-to-face visit. I am very limited in what I can treat by evisit.   I recommend a virtual urgent care video appointment or  you return to in person urgent care.   You can get a Virtual Urgent Care visit through MyChart or through the [facility] Care website: [email or web address]    NOTE: There will be NO CHARGE for this E-Visit   If you are having a true medical emergency, please call 911.     For an urgent face to face visit, [place] has multiple urgent care centers for your convenience.  Click the link below for the full list of locations and hours, walk-in wait times, appointment scheduling options and driving directions:  Urgent Care - [place], [place], [place], [place], [place], [place]  [place]     Your MyChart E-visit questionnaire answers were reviewed by a board certified advanced clinical practitioner to complete your personal care plan based on your specific symptoms.    Thank you for using e-Visits.    "
